# Patient Record
Sex: Male | Born: 1960 | Race: White | Hispanic: No | Marital: Married | Smoking: Never smoker
Health system: Southern US, Community
[De-identification: ages and names within clinical notes are randomized; demographics above are authoritative.]

## PROBLEM LIST (undated history)

## (undated) DIAGNOSIS — K219 Gastro-esophageal reflux disease without esophagitis: Secondary | ICD-10-CM

## (undated) DIAGNOSIS — F419 Anxiety disorder, unspecified: Secondary | ICD-10-CM

## (undated) DIAGNOSIS — F32A Depression, unspecified: Secondary | ICD-10-CM

## (undated) DIAGNOSIS — F329 Major depressive disorder, single episode, unspecified: Secondary | ICD-10-CM

## (undated) DIAGNOSIS — K449 Diaphragmatic hernia without obstruction or gangrene: Secondary | ICD-10-CM

## (undated) DIAGNOSIS — T7840XA Allergy, unspecified, initial encounter: Secondary | ICD-10-CM

## (undated) HISTORY — DX: Anxiety disorder, unspecified: F41.9

## (undated) HISTORY — DX: Depression, unspecified: F32.A

## (undated) HISTORY — DX: Major depressive disorder, single episode, unspecified: F32.9

## (undated) HISTORY — PX: CHOLECYSTECTOMY: SHX55

## (undated) HISTORY — DX: Diaphragmatic hernia without obstruction or gangrene: K44.9

## (undated) HISTORY — DX: Allergy, unspecified, initial encounter: T78.40XA

## (undated) HISTORY — DX: Gastro-esophageal reflux disease without esophagitis: K21.9

## (undated) HISTORY — PX: OTHER SURGICAL HISTORY: SHX169

---

## 1989-01-27 DIAGNOSIS — K449 Diaphragmatic hernia without obstruction or gangrene: Secondary | ICD-10-CM

## 1989-01-27 HISTORY — DX: Diaphragmatic hernia without obstruction or gangrene: K44.9

## 2015-04-21 ENCOUNTER — Ambulatory Visit (INDEPENDENT_AMBULATORY_CARE_PROVIDER_SITE_OTHER): Payer: BLUE CROSS/BLUE SHIELD | Admitting: Medical

## 2015-04-21 ENCOUNTER — Encounter: Payer: Self-pay | Admitting: Medical

## 2015-04-21 VITALS — BP 102/64 | HR 65 | Temp 97.9°F | Ht 74.5 in | Wt 218.4 lb

## 2015-04-21 DIAGNOSIS — R109 Unspecified abdominal pain: Secondary | ICD-10-CM | POA: Insufficient documentation

## 2015-04-21 DIAGNOSIS — F411 Generalized anxiety disorder: Secondary | ICD-10-CM

## 2015-04-21 DIAGNOSIS — R1032 Left lower quadrant pain: Secondary | ICD-10-CM

## 2015-04-21 DIAGNOSIS — Z1211 Encounter for screening for malignant neoplasm of colon: Secondary | ICD-10-CM | POA: Diagnosis not present

## 2015-04-21 DIAGNOSIS — R1010 Upper abdominal pain, unspecified: Secondary | ICD-10-CM | POA: Insufficient documentation

## 2015-04-21 LAB — COMPREHENSIVE METABOLIC PANEL
ALK PHOS: 39 U/L (ref 39–117)
ALT: 18 U/L (ref 0–53)
AST: 19 U/L (ref 0–37)
Albumin: 4 g/dL (ref 3.5–5.2)
BILIRUBIN TOTAL: 0.5 mg/dL (ref 0.2–1.2)
BUN: 14 mg/dL (ref 6–23)
CO2: 29 mEq/L (ref 19–32)
CREATININE: 0.99 mg/dL (ref 0.40–1.50)
Calcium: 9.4 mg/dL (ref 8.4–10.5)
Chloride: 105 mEq/L (ref 96–112)
GFR: 84.22 mL/min (ref >60.00)
GLUCOSE: 89 mg/dL (ref 70–99)
Potassium: 4.1 mEq/L (ref 3.5–5.1)
Sodium: 141 mEq/L (ref 135–145)
TOTAL PROTEIN: 6.6 g/dL (ref 6.0–8.3)

## 2015-04-21 LAB — CBC WITH DIFFERENTIAL/PLATELET
BASOS ABS: 0 10*3/uL (ref 0.0–0.1)
Basophils Relative: 0.5 % (ref 0.0–3.0)
Eosinophils Absolute: 0.2 10*3/uL (ref 0.0–0.7)
Eosinophils Relative: 2.1 % (ref 0.0–5.0)
HEMATOCRIT: 43.5 % (ref 39.0–52.0)
Hemoglobin: 14.3 g/dL (ref 13.0–17.0)
LYMPHS ABS: 1.9 10*3/uL (ref 0.7–4.0)
LYMPHS PCT: 25.6 % (ref 12.0–46.0)
MCHC: 33 g/dL (ref 30.0–36.0)
MCV: 89.6 fl (ref 78.0–100.0)
MONOS PCT: 9.7 % (ref 3.0–12.0)
Monocytes Absolute: 0.7 10*3/uL (ref 0.1–1.0)
NEUTROS PCT: 62.1 % (ref 43.0–77.0)
Neutro Abs: 4.5 10*3/uL (ref 1.4–7.7)
Platelets: 185 10*3/uL (ref 150.0–400.0)
RBC: 4.85 Mil/uL (ref 4.22–5.81)
RDW: 13.5 % (ref 11.5–15.5)
WBC: 7.3 10*3/uL (ref 4.0–10.5)

## 2015-04-21 MED ORDER — CIPROFLOXACIN HCL 500 MG PO TABS: 500.0000 mg | ORAL_TABLET | Freq: Two times a day (BID) | ORAL | Status: DC

## 2015-04-21 MED ORDER — METRONIDAZOLE 500 MG PO TABS: 500.0000 mg | ORAL_TABLET | Freq: Three times a day (TID) | ORAL | Status: DC

## 2015-04-21 MED ORDER — CLONAZEPAM 1 MG PO TABS: 1.0000 mg | ORAL_TABLET | Freq: Every day | ORAL | Status: DC

## 2015-04-21 NOTE — Assessment & Plan Note (Deleted)
Will get cbc, cmp today and order ifob. Will rx cipro and flagyl. If pain worsens over the weekend/thanksgiving  then ED evaluation. Will go ahead and refer pt to GI for Colonoscopy in near future.   In event he worsen this weekend. Explained that he may need ct abdomen.

## 2015-04-21 NOTE — Assessment & Plan Note (Signed)
Will get cbc, cmp today and order ifob. Will rx cipro and flagyl. If pain worsens over the weekend/thanksgiving  then ED evaluation. Will go ahead and refer pt to GI for Colonoscopy in near future.   In event he worsen this weekend. Explained that he may need ct abdomen.

## 2015-04-21 NOTE — Progress Notes (Signed)
Subjective:    Patient ID: Curtis Wood, male    DOB: 28-Nov-1960, 54 y.o.   MRN: [id number]  HPI   I have reviewed pt PMH, PSH, FH, Social History and Surgical History  Pt works for [employer] office in [place] Lived all his life in [place]. Just got employed.  Recent exercising. Pt states eats healthy, Married- 3 children.  Allergies- some in past and worse in [place]. Not bad locally.  Anxiety- hx of in past when he was in [place]. He would use clonopin for anxiety and insomnia. He only takes it at night.   Depression- some when he moved due to business closure in [place]. And when he moved to [place]. He was on SSRI lexapro in [place]. He is still on this. He wants to wean himself off eventually. Now that he is employed and moving to [place] he feels better.  Pt in states for about 2 wks he had some llq region pain that came gradual. No obvious diarrhea. No constant constipation. Some fatigue. No fever, no chills or sweats. Pt never had a colonoscopy. Pain is mild but he wanted to get checked before thanksgiving.   Review of Systems  Constitutional: Negative for fever, chills and fatigue.  Cardiovascular: Negative for chest pain and palpitations.  Gastrointestinal: Positive for abdominal pain. Negative for blood in stool and abdominal distention.  Musculoskeletal: Negative for back pain and arthralgias.  Skin: Negative for color change and rash.  Neurological: Negative for dizziness and light-headedness.  Hematological: Negative for adenopathy. Does not bruise/bleed easily.  Psychiatric/Behavioral: Positive for sleep disturbance. Negative for suicidal ideas, behavioral problems, decreased concentration and agitation. The patient is nervous/anxious.     Past Medical History  Diagnosis Date  . Allergy   . Anxiety   . Depression     Social History   Social History  . Marital Status: Married    Spouse Name: N/A  . Number of  Children: N/A  . Years of Education: N/A   Occupational History  . Not on file.   Social History Main Topics  . Smoking status: Never Smoker   . Smokeless tobacco: Never Used  . Alcohol Use: No     Comment: occasional wine   . Drug Use: No  . Sexual Activity: Not on file   Other Topics Concern  . Not on file   Social History Narrative  . No narrative on file    Past Surgical History  Procedure Laterality Date  . Cholecystectomy    . Surgery for pigeon breast repair    . Acl replacement Left     History reviewed. No pertinent family history.  No Known Allergies  No current outpatient prescriptions on file prior to visit.   No current facility-administered medications on file prior to visit.    BP 102/64 mmHg  Pulse 65  Temp(Src) 97.9 F (36.6 C) (Oral)  Ht 6' 2.5" (1.892 m)  Wt 218 lb 6.4 oz (99.066 kg)  BMI 27.67 kg/m2  SpO2 98%       Objective:   Physical Exam  General Mental Status- Alert. General Appearance- Not in acute distress.   Skin General: Color- Normal Color. Moisture- Normal Moisture.  Neck Carotid Arteries- Normal color. Moisture- Normal Moisture. No carotid bruits. No JVD.  Chest and Lung Exam Auscultation: Breath Sounds:-Normal.  Cardiovascular Auscultation:Rythm- Regular. Murmurs & Other Heart Sounds:Auscultation of the heart reveals- No Murmurs.  Abdomen Inspection:-Inspeection Normal. Palpation/Percussion:Note:No mass. Palpation and  Percussion of the abdomen reveal- only faint  Tender llq(no mass felt), Non Distended + BS, no rebound or guarding.  Neurologic Cranial Nerve exam:- CN III-XII intact(No nystagmus), symmetric smile. Strength:- 5/5 equal and symmetric strength both upper and lower extremities.  Back- no cva tenderness      Assessment & Plan:  [phone].

## 2015-04-21 NOTE — Assessment & Plan Note (Signed)
With some insomnia. Will still recommend using his lexapro 10 mg a day. Continue 1 mg of clonopin now.(Decrease from the 2 mg dose at night)

## 2015-04-21 NOTE — Patient Instructions (Signed)
Generalized anxiety disorder With some insomnia. Will still recommend using his lexapro 10 mg a day. Continue 1 mg of clonopin now.(Decrease from the 2 mg dose at night)  Pain in the abdomen Will get cbc, cmp today and order ifob. Will rx cipro and flagyl. If pain worsens over the weekend/thanksgiving  then ED evaluation. Will go ahead and refer pt to GI for Colonoscopy in near future.   In event he worsen this weekend. Explained that he may need ct abdomen.   Follow up in one month to discuss mood/anxiety.  But follow up early next week regarding abdomen. You can call on Monday update [place] or schedule that appointment.Marland Kitchen

## 2015-04-21 NOTE — Progress Notes (Signed)
Pre visit review using our clinic review tool, if applicable. No additional management support is needed unless otherwise documented below in the visit note. 

## 2015-04-26 ENCOUNTER — Telehealth: Payer: Self-pay | Admitting: Medical

## 2015-04-26 DIAGNOSIS — L989 Disorder of the skin and subcutaneous tissue, unspecified: Secondary | ICD-10-CM

## 2015-04-26 NOTE — Telephone Encounter (Signed)
Will you call pt tomorrow am on 04-27-2015.  See how he is doing. What level pain does he have in left lower quadrant . Did he turn in IFOB? Let me know how he is. I am either going to try to expedite his GI referral or go ahead and order ct abdomen pelvis. Let me know what he states. If he has extreme pain let me know I would probably advise direct ED evaluation if that were the case.(get stat ct and labs).

## 2015-04-26 NOTE — Telephone Encounter (Signed)
Edward please advise on note below on referral.

## 2015-04-26 NOTE — Telephone Encounter (Signed)
Caller name: Zamauri Relation to pt: self Call back number: [phone] Pharmacy:  Reason for call: Pt states was seen on 04-21-15 as new pt and mentioned that spoke with provider about being referred to Dermatologist for scalp, pt has not heard about referral, wants to be referred ASAP. Pt also mentioned that was given antibiotic that same day for abdominal pain on left side and states still is not feeling well pain is still there. Please advise.

## 2015-04-26 NOTE — Telephone Encounter (Signed)
Pt had very small area on scalp that had been present for months. Never getting better. He mentioned and showed me this toward end of exam on 1st visit. Will refer him to dermatologist.

## 2015-04-26 NOTE — Telephone Encounter (Signed)
Dr Rozann Lesches 11/30 @9 :30

## 2015-04-27 NOTE — Telephone Encounter (Signed)
Left message for pt to call back  °

## 2015-04-27 NOTE — Telephone Encounter (Signed)
Spoke with pt and states that he is going to see the GI this Thursday the 04/29/15. Pt is agreeable to any recommendations that ES and Gastroenterology advise. Per VO from ES the patient can go ahead with GI and wait for results to assess the need for a CT scan of the abdomen.

## 2015-04-29 ENCOUNTER — Ambulatory Visit (AMBULATORY_SURGERY_CENTER): Payer: Self-pay

## 2015-04-29 ENCOUNTER — Telehealth: Payer: Self-pay | Admitting: Medical

## 2015-04-29 ENCOUNTER — Other Ambulatory Visit (INDEPENDENT_AMBULATORY_CARE_PROVIDER_SITE_OTHER): Payer: BLUE CROSS/BLUE SHIELD

## 2015-04-29 VITALS — Ht 73.5 in | Wt 218.0 lb

## 2015-04-29 DIAGNOSIS — R1084 Generalized abdominal pain: Secondary | ICD-10-CM

## 2015-04-29 DIAGNOSIS — Z1211 Encounter for screening for malignant neoplasm of colon: Secondary | ICD-10-CM

## 2015-04-29 LAB — FECAL OCCULT BLOOD, IMMUNOCHEMICAL: FECAL OCCULT BLD: NEGATIVE

## 2015-04-29 MED ORDER — NA SULFATE-K SULFATE-MG SULF 17.5-3.13-1.6 GM/177ML PO SOLN: 1.0000 | Freq: Once | ORAL | Status: DC

## 2015-04-29 NOTE — Progress Notes (Signed)
No egg or soy allergies Not on home 02 No previous anesthesia complications No diet or weight loss meds 

## 2015-04-29 NOTE — Telephone Encounter (Signed)
Caller name: Neibert in Laboratory   Can be reached: [phone]   Reason for call: She is requesting a I FOB order for this pt. She says if you would please make the order future.

## 2015-04-29 NOTE — Telephone Encounter (Signed)
Order placed 04/29/15.

## 2015-05-03 ENCOUNTER — Telehealth: Payer: Self-pay | Admitting: Internal Medicine

## 2015-05-03 DIAGNOSIS — Z1211 Encounter for screening for malignant neoplasm of colon: Secondary | ICD-10-CM

## 2015-05-03 MED ORDER — NA SULFATE-K SULFATE-MG SULF 17.5-3.13-1.6 GM/177ML PO SOLN: 1.0000 | Freq: Once | ORAL | Status: DC

## 2015-05-03 NOTE — Telephone Encounter (Signed)
Resent suprep to pharmacy as requested by pt.  Called pt and LM on VM that identifies pt by first and last name that prep was resent and to call with further issues Lelan Pons PV

## 2015-05-06 ENCOUNTER — Encounter: Payer: Self-pay | Admitting: Internal Medicine

## 2015-05-13 ENCOUNTER — Encounter: Payer: Self-pay | Admitting: Internal Medicine

## 2015-05-13 ENCOUNTER — Ambulatory Visit (AMBULATORY_SURGERY_CENTER): Payer: BLUE CROSS/BLUE SHIELD | Admitting: Internal Medicine

## 2015-05-13 VITALS — BP 121/79 | HR 48 | Temp 96.3°F | Resp 16 | Ht 73.0 in | Wt 218.0 lb

## 2015-05-13 DIAGNOSIS — D123 Benign neoplasm of transverse colon: Secondary | ICD-10-CM

## 2015-05-13 DIAGNOSIS — D122 Benign neoplasm of ascending colon: Secondary | ICD-10-CM | POA: Diagnosis not present

## 2015-05-13 DIAGNOSIS — D120 Benign neoplasm of cecum: Secondary | ICD-10-CM

## 2015-05-13 DIAGNOSIS — Z1211 Encounter for screening for malignant neoplasm of colon: Secondary | ICD-10-CM | POA: Diagnosis present

## 2015-05-13 DIAGNOSIS — K635 Polyp of colon: Secondary | ICD-10-CM | POA: Diagnosis not present

## 2015-05-13 MED ORDER — SODIUM CHLORIDE 0.9 % IV SOLN: 500.0000 mL | INTRAVENOUS | Status: DC

## 2015-05-13 NOTE — Progress Notes (Signed)
Called to room to assist during endoscopic procedure.  Patient ID and intended procedure confirmed with present staff. Received instructions for my participation in the procedure from the performing physician.  

## 2015-05-13 NOTE — Patient Instructions (Signed)
YOU HAD AN ENDOSCOPIC PROCEDURE TODAY AT [facility]:   Refer to the procedure report that was given to you for any specific questions about what was found during the examination.  If the procedure report does not answer your questions, please call your gastroenterologist to clarify.  If you requested that your care partner not be given the details of your procedure findings, then the procedure report has been included in a sealed envelope for you to review at your convenience later.  YOU SHOULD EXPECT: Some feelings of bloating in the abdomen. Passage of more gas than usual.  Walking can help get rid of the air that was put into your GI tract during the procedure and reduce the bloating. If you had a lower endoscopy (such as a colonoscopy or flexible sigmoidoscopy) you may notice spotting of blood in your stool or on the toilet paper. If you underwent a bowel prep for your procedure, you may not have a normal bowel movement for a few days.  Please Note:  You might notice some irritation and congestion in your nose or some drainage.  This is from the oxygen used during your procedure.  There is no need for concern and it should clear up in a day or so.  SYMPTOMS TO REPORT IMMEDIATELY:   Following lower endoscopy (colonoscopy or flexible sigmoidoscopy):  Excessive amounts of blood in the stool  Significant tenderness or worsening of abdominal pains  Swelling of the abdomen that is new, acute  Fever of 100F or higher  For urgent or emergent issues, a gastroenterologist can be reached at any hour by calling [phone].   DIET: Your first meal following the procedure should be a small meal and then it is ok to progress to your normal diet. Heavy or fried foods are harder to digest and may make you feel nauseous or bloated.  Likewise, meals heavy in dairy and vegetables can increase bloating.  Drink plenty of fluids but you should avoid alcoholic beverages for 24  hours.  ACTIVITY:  You should plan to take it easy for the rest of today and you should NOT DRIVE or use heavy machinery until tomorrow (because of the sedation medicines used during the test).    FOLLOW UP: Our staff will call the number listed on your records the next business day following your procedure to check on you and address any questions or concerns that you may have regarding the information given to you following your procedure. If we do not reach you, we will leave a message.  However, if you are feeling well and you are not experiencing any problems, there is no need to return our call.  We will assume that you have returned to your regular daily activities without incident.  If any biopsies were taken you will be contacted by phone or by letter within the next 1-3 weeks.  Please call us at [phone] if you have not heard about the biopsies in 3 weeks.   SIGNATURES/CONFIDENTIALITY: You and/or your care partner have signed paperwork which will be entered into your electronic medical record.  These signatures attest to the fact that that the information above on your After Visit Summary has been reviewed and is understood.  Full responsibility of the confidentiality of this discharge information lies with you and/or your care-partner.  AWAIT PATHOLOGY  Please read over handouts about polyps, diverticulosis, and high fiber diets  Continue your normal medications

## 2015-05-13 NOTE — Progress Notes (Signed)
To recovery, report to [place]. Harden Mo, Therapist, sports, VSS

## 2015-05-13 NOTE — Op Note (Signed)
[place]  [address] [place], [address]   COLONOSCOPY PROCEDURE REPORT  PATIENT: Curtis Wood, Curtis Wood  MR#: [id number] BIRTHDATE: 1960/06/25 , 54  yrs. old GENDER: male ENDOSCOPIST: Jerene Bears, MD REFERRED BY: Dr. Harvie Heck PROCEDURE DATE:  05/13/2015 PROCEDURE:   Colonoscopy, screening and Colonoscopy with snare polypectomy First Screening Colonoscopy - Avg.  risk and is 50 yrs.  old or older Yes.  Prior Negative Screening - Now for repeat screening. N/A  History of Adenoma - Now for follow-up colonoscopy & has been > or = to 3 yrs.  N/A  Polyps removed today? Yes ASA CLASS:   Class II INDICATIONS:Screening for colonic neoplasia, Colorectal Neoplasm Risk Assessment for this procedure is average risk, and 1st colonoscopy. MEDICATIONS: Monitored anesthesia care and Propofol 200 mg IV  DESCRIPTION OF PROCEDURE:   After the risks benefits and alternatives of the procedure were thoroughly explained, informed consent was obtained.  The digital rectal exam revealed no rectal mass.   The LB [id number]  endoscope was introduced through the anus and advanced to the terminal ileum which was intubated for a short distance. No adverse events experienced.   The quality of the prep was good.  (Suprep was used)  The instrument was then slowly withdrawn as the colon was fully examined. Estimated blood loss is zero unless otherwise noted in this procedure report.    COLON FINDINGS: The examined terminal ileum appeared to be normal. A sessile polyp measuring 10 mm in size with a mucous cap was found at the cecum.  A polypectomy was performed with a cold snare.  The resection was complete, the polyp tissue was completely retrieved and sent to histology.   Two sessile polyps ranging between 3-53mm in size were found in the ascending colon and transverse colon. Polypectomies were performed with a cold snare.  The resection was complete, the polyp tissue was completely  retrieved and sent to histology.   There was mild diverticulosis noted in the descending colon and sigmoid colon.  Retroflexed views revealed internal hemorrhoids. The time to cecum = 3.2 Withdrawal time = 16.5   The scope was withdrawn and the procedure completed.  COMPLICATIONS: There were no immediate complications.  ENDOSCOPIC IMPRESSION: 1.   The examined terminal ileum appeared to be normal 2.   Sessile polyp was found at the cecum; polypectomy was performed with a cold snare 3.   Two sessile polyps ranging between 3-78mm in size were found in the ascending colon and transverse colon; polypectomies were performed with a cold snare 4.   Mild diverticulosis was noted in the descending colon and sigmoid colon  RECOMMENDATIONS: 1.  Await pathology results 2.  High fiber diet 3.  If the polyps removed today are proven to be adenomatous (pre-cancerous) polyps, you will need a colonoscopy in 3 years. Otherwise you should continue to follow colorectal cancer screening guidelines for "routine risk" patients with a colonoscopy in 10 years.  You will receive a letter within 1-2 weeks with the results of your biopsy as well as final recommendations.  Please call my office if you have not received a letter after 3 weeks.  eSigned:  Jerene Bears, MD 05/13/2015 12:07 PM   cc:  the patient, PCP   PATIENT NAME:  Curtis Wood, Curtis Wood MR#: [id number]

## 2015-05-14 ENCOUNTER — Telehealth: Payer: Self-pay | Admitting: *Deleted

## 2015-05-14 NOTE — Telephone Encounter (Signed)
  Follow up Call-  Call back number 05/13/2015  Post procedure Call Back phone  # [phone]  Permission to leave phone message Yes     Patient questions:  Message left to call us if necessary.

## 2015-05-21 ENCOUNTER — Ambulatory Visit (INDEPENDENT_AMBULATORY_CARE_PROVIDER_SITE_OTHER): Payer: BLUE CROSS/BLUE SHIELD | Admitting: Medical

## 2015-05-21 ENCOUNTER — Encounter: Payer: Self-pay | Admitting: Medical

## 2015-05-21 VITALS — BP 118/76 | HR 76 | Temp 98.1°F | Ht 74.5 in | Wt 220.2 lb

## 2015-05-21 DIAGNOSIS — F411 Generalized anxiety disorder: Secondary | ICD-10-CM | POA: Diagnosis not present

## 2015-05-21 DIAGNOSIS — R1032 Left lower quadrant pain: Secondary | ICD-10-CM

## 2015-05-21 DIAGNOSIS — Z23 Encounter for immunization: Secondary | ICD-10-CM | POA: Diagnosis not present

## 2015-05-21 MED ORDER — CLONAZEPAM 0.5 MG PO TABS: 0.5000 mg | ORAL_TABLET | Freq: Every day | ORAL | Status: DC

## 2015-05-21 NOTE — Assessment & Plan Note (Signed)
Resolved with antibiotics. Pt saw GI.

## 2015-05-21 NOTE — Addendum Note (Signed)
Addended by: Tasia Catchings on: 05/21/2015 09:54 AM   Modules accepted: Orders

## 2015-05-21 NOTE — Patient Instructions (Signed)
Generalized anxiety disorder Will continue same dose lexapro. But will rx clonazapem but 0.5 mg dose to continue taper. Call me in one month or mychart me to let me know how you are and then may taper clonazepam further.  Pain in the abdomen Resolved with antibiotics. Pt saw GI.    Follow up in 2 months for CPE.

## 2015-05-21 NOTE — Assessment & Plan Note (Signed)
Will continue same dose lexapro. But will rx clonazapem but 0.5 mg dose to continue taper. Call me in one month or mychart me to let me know how you are and then may taper clonazepam further.

## 2015-05-21 NOTE — Progress Notes (Signed)
Subjective:    Patient ID: Curtis Wood, male    DOB: [birth year], 54 y.o.   MRN: [id number]  HPI   Pt in for follow up. He had some polyps and mild diverticulosis. Pt will follow up in 3 months.  Pt in states he feels good with his mood. He is not feeling real  anxious. Pt states he only taking clonopin 1 mg one time a day at night. Previously he was on 2 mg at night. He wants to get off but wants to avoid withdrawal type symptoms.  He wants to stop lexapro. But we discussed and advised stay on current dose. Not to taper off presently.  With  New job and getting settled in prior anxiety is dissipating.  Pt no longer has any abdomen pain. I rx'd flagyl and cipro. Pt later had diverticulosis dx by GI on scope. So he may have had diverticulitis.   Review of Systems  Constitutional: Negative for fever, chills, diaphoresis, activity change and fatigue.  Respiratory: Negative for cough, chest tightness and shortness of breath.   Cardiovascular: Negative for chest pain, palpitations and leg swelling.  Gastrointestinal: Negative for nausea, vomiting and abdominal pain.  Musculoskeletal: Negative for neck pain and neck stiffness.  Neurological: Negative for dizziness, speech difficulty, weakness, numbness and headaches.  Psychiatric/Behavioral: Negative for behavioral problems, confusion, dysphoric mood and agitation. The patient is not nervous/anxious.        Pt mood and anxiety is better.   Past Medical History  Diagnosis Date  . Allergy   . Anxiety   . Depression   . GERD (gastroesophageal reflux disease)   . Hiatal hernia 1990's    Social History   Social History  . Marital Status: Married    Spouse Name: N/A  . Number of Children: N/A  . Years of Education: N/A   Occupational History  . Not on file.   Social History Main Topics  . Smoking status: Never Smoker   . Smokeless tobacco: Never Used  . Alcohol Use: 0.0 oz/week    0 Standard drinks or equivalent per week       Comment: occasional wine   . Drug Use: No  . Sexual Activity: Not on file   Other Topics Concern  . Not on file   Social History Narrative    Past Surgical History  Procedure Laterality Date  . Cholecystectomy    . Surgery for pigeon breast repair    . Acl replacement Left     Family History  Problem Relation Age of Onset  . Colon cancer Neg Hx     No Known Allergies  Current Outpatient Prescriptions on File Prior to Visit  Medication Sig Dispense Refill  . escitalopram (LEXAPRO) 10 MG tablet Take 10 mg by mouth daily.     No current facility-administered medications on file prior to visit.    BP 118/76 mmHg  Pulse 76  Temp(Src) 98.1 F (36.7 C) (Oral)  Ht 6' 2.5" (1.892 m)  Wt 220 lb 3.2 oz (99.882 kg)  BMI 27.90 kg/m2  SpO2 97%       Objective:   Physical Exam  General Mental Status- Alert. General Appearance- Not in acute distress.   Skin General: Color- Normal Color. Moisture- Normal Moisture.  .Chest and Lung Exam Auscultation: Breath Sounds:-Normal.  Cardiovascular Auscultation:Rythm- Regular. Murmurs & Other Heart Sounds:Auscultation of the heart reveals- No Murmurs.  Abdomen Inspection:-Inspeection Normal. Palpation/Percussion:Note:No mass. Palpation and Percussion of the abdomen reveal- Non Tender, Non Distended +  BS, no rebound or guarding.    Neurologic Cranial Nerve exam:- CN III-XII intact(No nystagmus), symmetric smile. Strength:- 5/5 equal and symmetric strength both upper and lower extremities.      Assessment & Plan:

## 2015-05-21 NOTE — Progress Notes (Signed)
Pre visit review using our clinic review tool, if applicable. No additional management support is needed unless otherwise documented below in the visit note. 

## 2015-05-25 ENCOUNTER — Encounter: Payer: Self-pay | Admitting: Internal Medicine

## 2015-06-24 ENCOUNTER — Encounter: Payer: Self-pay | Admitting: Medical

## 2015-06-24 ENCOUNTER — Ambulatory Visit (INDEPENDENT_AMBULATORY_CARE_PROVIDER_SITE_OTHER): Payer: BLUE CROSS/BLUE SHIELD | Admitting: Medical

## 2015-06-24 VITALS — BP 120/78 | HR 78 | Temp 98.1°F | Ht 74.5 in | Wt 220.2 lb

## 2015-06-24 DIAGNOSIS — M542 Cervicalgia: Secondary | ICD-10-CM

## 2015-06-24 DIAGNOSIS — H109 Unspecified conjunctivitis: Secondary | ICD-10-CM | POA: Diagnosis not present

## 2015-06-24 DIAGNOSIS — J3489 Other specified disorders of nose and nasal sinuses: Secondary | ICD-10-CM

## 2015-06-24 DIAGNOSIS — R5383 Other fatigue: Secondary | ICD-10-CM

## 2015-06-24 DIAGNOSIS — R6882 Decreased libido: Secondary | ICD-10-CM

## 2015-06-24 MED ORDER — CLONAZEPAM 0.5 MG PO TABS: 0.5000 mg | ORAL_TABLET | Freq: Every day | ORAL | Status: DC

## 2015-06-24 MED ORDER — TOBRAMYCIN 0.3 % OP SOLN: 2.0000 [drp] | Freq: Four times a day (QID) | OPHTHALMIC | Status: DC

## 2015-06-24 MED ORDER — FLUTICASONE PROPIONATE 50 MCG/ACT NA SUSP: 2.0000 | Freq: Every day | NASAL | Status: DC

## 2015-06-24 MED ORDER — DICLOFENAC SODIUM 75 MG PO TBEC: 75.0000 mg | DELAYED_RELEASE_TABLET | Freq: Two times a day (BID) | ORAL | Status: DC

## 2015-06-24 NOTE — Patient Instructions (Addendum)
For conjunctivitis will prescribe tobrex eye solution. Use on both eyes.  For neck pain will get cspine xray. Rx of diclofenac for pain.  For sinus pressure will prescribe flonase nasal spray. Will see how you respond to spray. Then consider antibiotic.  Follow up 7 days or as needed.

## 2015-06-24 NOTE — Progress Notes (Signed)
Subjective:    Patient ID: Curtis Wood, male    DOB: 06-21-1960, 55 y.o.   MRN: [id number]  HPI   Rt eye irritation. Pt states his eye was red for 2 days. Pink appearance today. Pt creamy yellow discharge to his rt eye this am when he woke. No trauma or injury. No know contact to person with conjunctivitis.  Since last time I saw him he reports various symptoms and has scheduled physical exam.  Describes bloating abdomen, fatigue, and feeling like loosing muscle mass. Neck pain and some pain running down his arm at times.   Anxiety- pt still has some anxiety.  Pt stopped lexapro by himself. I wanted him to wait.   Review of Systems  Constitutional: Positive for fatigue. Negative for fever and chills.  Eyes: Positive for discharge and redness.  Respiratory: Negative for cough, chest tightness, shortness of breath and wheezing.   Cardiovascular: Negative for chest pain and palpitations.  Gastrointestinal:       Abdomen bloating.  Musculoskeletal: Negative for back pain.  Neurological: Negative for dizziness and headaches.  Hematological: Negative for adenopathy. Does not bruise/bleed easily.  Psychiatric/Behavioral: Negative for behavioral problems and confusion.    Past Medical History  Diagnosis Date  . Allergy   . Anxiety   . Depression   . GERD (gastroesophageal reflux disease)   . Hiatal hernia 1990's    Social History   Social History  . Marital Status: Married    Spouse Name: N/A  . Number of Children: N/A  . Years of Education: N/A   Occupational History  . Not on file.   Social History Main Topics  . Smoking status: Never Smoker   . Smokeless tobacco: Never Used  . Alcohol Use: 0.0 oz/week    0 Standard drinks or equivalent per week     Comment: occasional wine   . Drug Use: No  . Sexual Activity: Not on file   Other Topics Concern  . Not on file   Social History Narrative    Past Surgical History  Procedure Laterality Date  .  Cholecystectomy    . Surgery for pigeon breast repair    . Acl replacement Left     Family History  Problem Relation Age of Onset  . Colon cancer Neg Hx     No Known Allergies  Current Outpatient Prescriptions on File Prior to Visit  Medication Sig Dispense Refill  . escitalopram (LEXAPRO) 10 MG tablet Take 10 mg by mouth daily.     No current facility-administered medications on file prior to visit.    BP 120/78 mmHg  Pulse 78  Temp(Src) 98.1 F (36.7 C) (Oral)  Ht 6' 2.5" (1.892 m)  Wt 220 lb 3.2 oz (99.882 kg)  BMI 27.90 kg/m2  SpO2 97%       Objective:   Physical Exam  General  Mental Status - Alert. General Appearance - Well groomed. Not in acute distress.  Rt eye- mild conjuncitva injection( no matting presently). Lt eye- normal.  Skin Rashes- No Rashes.  HEENT Head- Normal. Ear Auditory Canal - Left- Normal. Right - Normal.Tympanic Membrane- Left- Normal. Right- Normal. Eye Sclera/Conjunctiva- Left- Normal. Right- Normal. Nose & Sinuses Nasal Mucosa- Left-  Boggy and Congested. Right-  Boggy and  Congested.Bilateral no maxillary and no frontal sinus pressure. Mouth & Throat Lips: Upper Lip- Normal: no dryness, cracking, pallor, cyanosis, or vesicular eruption. Lower Lip-Normal: no dryness, cracking, pallor, cyanosis or vesicular eruption. Buccal Mucosa- Bilateral- No  Aphthous ulcers. Oropharynx- No Discharge or Erythema. Tonsils: Characteristics- Bilateral- No Erythema or Congestion. Size/Enlargement- Bilateral- No enlargement. Discharge- bilateral-None.  Neck Neck- Supple. No Masses. Mid lower neck pain posterior aspect    Chest and Lung Exam Auscultation: Breath Sounds:-Clear even and unlabored.  Cardiovascular Auscultation:Rythm- Regular, rate and rhythm. Murmurs & Other Heart Sounds:Ausculatation of the heart reveal- No Murmurs.  Lymphatic Head & Neck General Head & Neck Lymphatics: Bilateral: Description- No Localized  lymphadenopathy.       Assessment & Plan:  For conjunctivitis will prescribe tobrex eye solution. Use on both eyes.  For neck pain will get cspine xray. Rx of diclofenac for pain.  For sinus pressure will prescribe flonase nasal spray. Will see how you respond to spray. Then consider antibiotic.  Follow up 7 days or as needed.  Pt only scheduled for 15 minutes then listed various complaints. I simply did not have enough time to fully address all these complaints but did order cspine xray. And due to his decrease libido and fatigue complaint  Placed order to have testosterone to be done early am. Also ask he move his complete physical exam date up if possible.

## 2015-06-24 NOTE — Progress Notes (Signed)
Pre visit review using our clinic review tool, if applicable. No additional management support is needed unless otherwise documented below in the visit note. 

## 2015-06-25 ENCOUNTER — Other Ambulatory Visit (INDEPENDENT_AMBULATORY_CARE_PROVIDER_SITE_OTHER): Payer: BLUE CROSS/BLUE SHIELD

## 2015-06-25 ENCOUNTER — Telehealth: Payer: Self-pay | Admitting: General Practice

## 2015-06-25 ENCOUNTER — Telehealth: Payer: Self-pay | Admitting: Medical

## 2015-06-25 ENCOUNTER — Encounter: Payer: Self-pay | Admitting: General Practice

## 2015-06-25 ENCOUNTER — Ambulatory Visit: Payer: BLUE CROSS/BLUE SHIELD | Admitting: Medical

## 2015-06-25 DIAGNOSIS — R5383 Other fatigue: Secondary | ICD-10-CM

## 2015-06-25 DIAGNOSIS — R6882 Decreased libido: Secondary | ICD-10-CM | POA: Diagnosis not present

## 2015-06-25 NOTE — Telephone Encounter (Signed)
Test code has recently changed to [id number] for men. Test does have women in parenthesis by it due to error in epic or [facility] part.

## 2015-06-25 NOTE — Telephone Encounter (Signed)
Caller name:Willim Relationship to patient:self Can be reached: Pharmacy:[facility]  Reason for call:patient was in yesterday  And his klonizapam rx was not at [facility] with his other meds

## 2015-06-25 NOTE — Telephone Encounter (Signed)
Curtis Wood I saw my order was cancelled then other other was placed that showed male beside testosterone level?? Don't understand why that was done.

## 2015-06-25 NOTE — Telephone Encounter (Signed)
Pre-visit phone call for pt completed. Chart updated to reflect.  Per pt clonazepam refill was never received by pharmacy, this was called in on 06/25/15 (authorized by Ronalee Belts at [employer])

## 2015-06-28 ENCOUNTER — Ambulatory Visit (HOSPITAL_BASED_OUTPATIENT_CLINIC_OR_DEPARTMENT_OTHER)
Admission: RE | Admit: 2015-06-28 | Discharge: 2015-06-28 | Disposition: A | Discharge status: Home / Self Care | Payer: BLUE CROSS/BLUE SHIELD | Source: Ambulatory Visit | Attending: Medical | Admitting: Medical

## 2015-06-28 ENCOUNTER — Ambulatory Visit (INDEPENDENT_AMBULATORY_CARE_PROVIDER_SITE_OTHER): Payer: BLUE CROSS/BLUE SHIELD | Admitting: Medical

## 2015-06-28 ENCOUNTER — Encounter: Payer: Self-pay | Admitting: Medical

## 2015-06-28 ENCOUNTER — Encounter: Payer: BLUE CROSS/BLUE SHIELD | Admitting: Medical

## 2015-06-28 ENCOUNTER — Other Ambulatory Visit: Payer: Self-pay | Admitting: Medical

## 2015-06-28 VITALS — BP 110/70 | HR 65 | Temp 98.1°F | Ht 74.5 in | Wt 222.0 lb

## 2015-06-28 DIAGNOSIS — M542 Cervicalgia: Secondary | ICD-10-CM

## 2015-06-28 DIAGNOSIS — Z0000 Encounter for general adult medical examination without abnormal findings: Secondary | ICD-10-CM | POA: Insufficient documentation

## 2015-06-28 DIAGNOSIS — Z125 Encounter for screening for malignant neoplasm of prostate: Secondary | ICD-10-CM

## 2015-06-28 DIAGNOSIS — M5412 Radiculopathy, cervical region: Secondary | ICD-10-CM | POA: Diagnosis not present

## 2015-06-28 LAB — POC URINALSYSI DIPSTICK (AUTOMATED)
BILIRUBIN UA: NEGATIVE
Blood, UA: NEGATIVE
GLUCOSE UA: NEGATIVE
KETONES UA: NEGATIVE
LEUKOCYTES UA: NEGATIVE
Nitrite, UA: NEGATIVE
PROTEIN UA: NEGATIVE
Spec Grav, UA: 1.025
Urobilinogen, UA: 0.2
pH, UA: 6

## 2015-06-28 LAB — COMPREHENSIVE METABOLIC PANEL
ALT: 13 U/L (ref 0–53)
AST: 17 U/L (ref 0–37)
Albumin: 4.3 g/dL (ref 3.5–5.2)
Alkaline Phosphatase: 34 U/L — ABNORMAL LOW (ref 39–117)
BUN: 19 mg/dL (ref 6–23)
CALCIUM: 9.2 mg/dL (ref 8.4–10.5)
CHLORIDE: 104 meq/L (ref 96–112)
CO2: 30 meq/L (ref 19–32)
Creatinine, Ser: 0.97 mg/dL (ref 0.40–1.50)
GFR: 86.17 mL/min (ref >60.00)
GLUCOSE: 89 mg/dL (ref 70–99)
Potassium: 3.9 mEq/L (ref 3.5–5.1)
Sodium: 141 mEq/L (ref 135–145)
Total Bilirubin: 0.5 mg/dL (ref 0.2–1.2)
Total Protein: 6.7 g/dL (ref 6.0–8.3)

## 2015-06-28 LAB — CBC WITH DIFFERENTIAL/PLATELET
BASOS PCT: 0.5 % (ref 0.0–3.0)
Basophils Absolute: 0 10*3/uL (ref 0.0–0.1)
EOS ABS: 0.2 10*3/uL (ref 0.0–0.7)
Eosinophils Relative: 3.3 % (ref 0.0–5.0)
HEMATOCRIT: 44.5 % (ref 39.0–52.0)
Hemoglobin: 14.5 g/dL (ref 13.0–17.0)
LYMPHS ABS: 1.8 10*3/uL (ref 0.7–4.0)
LYMPHS PCT: 27 % (ref 12.0–46.0)
MCHC: 32.7 g/dL (ref 30.0–36.0)
MCV: 89.4 fl (ref 78.0–100.0)
Monocytes Absolute: 0.5 10*3/uL (ref 0.1–1.0)
Monocytes Relative: 7.9 % (ref 3.0–12.0)
NEUTROS ABS: 4 10*3/uL (ref 1.4–7.7)
Neutrophils Relative %: 61.3 % (ref 43.0–77.0)
PLATELETS: 191 10*3/uL (ref 150.0–400.0)
RBC: 4.97 Mil/uL (ref 4.22–5.81)
RDW: 13.4 % (ref 11.5–15.5)
WBC: 6.5 10*3/uL (ref 4.0–10.5)

## 2015-06-28 LAB — LIPID PANEL
CHOL/HDL RATIO: 3
CHOLESTEROL: 171 mg/dL (ref 0–200)
HDL: 55.7 mg/dL (ref >39.00)
LDL CALC: 107 mg/dL — AB (ref 0–99)
NonHDL: 115.53
TRIGLYCERIDES: 41 mg/dL (ref 0.0–149.0)
VLDL: 8.2 mg/dL (ref 0.0–40.0)

## 2015-06-28 LAB — TSH: TSH: 1.14 u[IU]/mL (ref 0.35–4.50)

## 2015-06-28 LAB — PSA: PSA: 1.52 ng/mL (ref 0.10–4.00)

## 2015-06-28 MED ORDER — MOMETASONE FUROATE 50 MCG/ACT NA SUSP: NASAL | Status: DC

## 2015-06-28 NOTE — Patient Instructions (Addendum)
Wellness examination Cbc, cmp, tsh, psa and lipid panel. Will get UA.     Will follow above labs for your fatigue and other symptoms.(Will ask labs to draw more in event abnormality)  Regarding mood would offer counseling if you feel you would benefit from.  Follow date to be determined after labs are back.  I called downstairs and ask them to run nasonex. Per pharmacy they don't pay for any prescription. He has to get otc.   Preventive Care for Adults, Male A healthy lifestyle and preventive care can promote health and wellness. Preventive health guidelines for men include the following key practices:  A routine yearly physical is a good way to check with your health care provider about your health and preventative screening. It is a chance to share any concerns and updates on your health and to receive a thorough exam.  Visit your dentist for a routine exam and preventative care every 6 months. Brush your teeth twice a day and floss once a day. Good oral hygiene prevents tooth decay and gum disease.  The frequency of eye exams is based on your age, health, family medical history, use of contact lenses, and other factors. Follow your health care provider's recommendations for frequency of eye exams.  Eat a healthy diet. Foods such as vegetables, fruits, whole grains, low-fat dairy products, and lean protein foods contain the nutrients you need without too many calories. Decrease your intake of foods high in solid fats, added sugars, and salt. Eat the right amount of calories for you.Get information about a proper diet from your health care provider, if necessary.  Regular physical exercise is one of the most important things you can do for your health. Most adults should get at least 150 minutes of moderate-intensity exercise (any activity that increases your heart rate and causes you to sweat) each week. In addition, most adults need muscle-strengthening exercises on 2 or more days a  week.  Maintain a healthy weight. The body mass index (BMI) is a screening tool to identify possible weight problems. It provides an estimate of body fat based on height and weight. Your health care provider can find your BMI and can help you achieve or maintain a healthy weight.For adults 20 years and older:  A BMI below 18.5 is considered underweight.  A BMI of 18.5 to 24.9 is normal.  A BMI of 25 to 29.9 is considered overweight.  A BMI of 30 and above is considered obese.  Maintain normal blood lipids and cholesterol levels by exercising and minimizing your intake of saturated fat. Eat a balanced diet with plenty of fruit and vegetables. Blood tests for lipids and cholesterol should begin at age 57 and be repeated every 5 years. If your lipid or cholesterol levels are high, you are over 50, or you are at high risk for heart disease, you may need your cholesterol levels checked more frequently.Ongoing high lipid and cholesterol levels should be treated with medicines if diet and exercise are not working.  If you smoke, find out from your health care provider how to quit. If you do not use tobacco, do not start.  Lung cancer screening is recommended for adults aged 25-80 years who are at high risk for developing lung cancer because of a history of smoking. A yearly low-dose CT scan of the lungs is recommended for people who have at least a 30-pack-year history of smoking and are a current smoker or have quit within the past 15 years. A  pack year of smoking is smoking an average of 1 pack of cigarettes a day for 1 year (for example: 1 pack a day for 30 years or 2 packs a day for 15 years). Yearly screening should continue until the smoker has stopped smoking for at least 15 years. Yearly screening should be stopped for people who develop a health problem that would prevent them from having lung cancer treatment.  If you choose to drink alcohol, do not have more than 2 drinks per day. One drink  is considered to be 12 ounces (355 mL) of beer, 5 ounces (148 mL) of wine, or 1.5 ounces (44 mL) of liquor.  Avoid use of street drugs. Do not share needles with anyone. Ask for help if you need support or instructions about stopping the use of drugs.  High blood pressure causes heart disease and increases the risk of stroke. Your blood pressure should be checked at least every 1-2 years. Ongoing high blood pressure should be treated with medicines, if weight loss and exercise are not effective.  If you are 97-56 years old, ask your health care provider if you should take aspirin to prevent heart disease.  Diabetes screening is done by taking a blood sample to check your blood glucose level after you have not eaten for a certain period of time (fasting). If you are not overweight and you do not have risk factors for diabetes, you should be screened once every 3 years starting at age 21. If you are overweight or obese and you are 61-23 years of age, you should be screened for diabetes every year as part of your cardiovascular risk assessment.  Colorectal cancer can be detected and often prevented. Most routine colorectal cancer screening begins at the age of 6 and continues through age 36. However, your health care provider may recommend screening at an earlier age if you have risk factors for colon cancer. On a yearly basis, your health care provider may provide home test kits to check for hidden blood in the stool. Use of a small camera at the end of a tube to directly examine the colon (sigmoidoscopy or colonoscopy) can detect the earliest forms of colorectal cancer. Talk to your health care provider about this at age 41, when routine screening begins. Direct exam of the colon should be repeated every 5-10 years through age 56, unless early forms of precancerous polyps or small growths are found.  People who are at an increased risk for hepatitis B should be screened for this virus. You are considered  at high risk for hepatitis B if:  You were born in a country where hepatitis B occurs often. Talk with your health care provider about which countries are considered high risk.  Your parents were born in a high-risk country and you have not received a shot to protect against hepatitis B (hepatitis B vaccine).  You have HIV or AIDS.  You use needles to inject street drugs.  You live with, or have sex with, someone who has hepatitis B.  You are a man who has sex with other men (MSM).  You get hemodialysis treatment.  You take certain medicines for conditions such as cancer, organ transplantation, and autoimmune conditions.  Hepatitis C blood testing is recommended for all people born from [birth year] through 1965 and any individual with known risks for hepatitis C.  Practice safe sex. Use condoms and avoid high-risk sexual practices to reduce the spread of sexually transmitted infections (STIs). STIs include gonorrhea,  chlamydia, syphilis, trichomonas, herpes, HPV, and human immunodeficiency virus (HIV). Herpes, HIV, and HPV are viral illnesses that have no cure. They can result in disability, cancer, and death.  If you are a man who has sex with other men, you should be screened at least once per year for:  HIV.  Urethral, rectal, and pharyngeal infection of gonorrhea, chlamydia, or both.  If you are at risk of being infected with HIV, it is recommended that you take a prescription medicine daily to prevent HIV infection. This is called preexposure prophylaxis (PrEP). You are considered at risk if:  You are a man who has sex with other men (MSM) and have other risk factors.  You are a heterosexual man, are sexually active, and are at increased risk for HIV infection.  You take drugs by injection.  You are sexually active with a partner who has HIV.  Talk with your health care provider about whether you are at high risk of being infected with HIV. If you choose to begin PrEP, you should  first be tested for HIV. You should then be tested every 3 months for as long as you are taking PrEP.  A one-time screening for abdominal aortic aneurysm (AAA) and surgical repair of large AAAs by ultrasound are recommended for men ages 67 to 77 years who are current or former smokers.  Healthy men should no longer receive prostate-specific antigen (PSA) blood tests as part of routine cancer screening. Talk with your health care provider about prostate cancer screening.  Testicular cancer screening is not recommended for adult males who have no symptoms. Screening includes self-exam, a health care provider exam, and other screening tests. Consult with your health care provider about any symptoms you have or any concerns you have about testicular cancer.  Use sunscreen. Apply sunscreen liberally and repeatedly throughout the day. You should seek shade when your shadow is shorter than you. Protect yourself by wearing long sleeves, pants, a wide-brimmed hat, and sunglasses year round, whenever you are outdoors.  Once a month, do a whole-body skin exam, using a mirror to look at the skin on your back. Tell your health care provider about new moles, moles that have irregular borders, moles that are larger than a pencil eraser, or moles that have changed in shape or color.  Stay current with required vaccines (immunizations).  Influenza vaccine. All adults should be immunized every year.  Tetanus, diphtheria, and acellular pertussis (Td, Tdap) vaccine. An adult who has not previously received Tdap or who does not know his vaccine status should receive 1 dose of Tdap. This initial dose should be followed by tetanus and diphtheria toxoids (Td) booster doses every 10 years. Adults with an unknown or incomplete history of completing a 3-dose immunization series with Td-containing vaccines should begin or complete a primary immunization series including a Tdap dose. Adults should receive a Td booster every 10  years.  Varicella vaccine. An adult without evidence of immunity to varicella should receive 2 doses or a second dose if he has previously received 1 dose.  Human papillomavirus (HPV) vaccine. Males aged 11-21 years who have not received the vaccine previously should receive the 3-dose series. Males aged 22-26 years may be immunized. Immunization is recommended through the age of 12 years for any male who has sex with males and did not get any or all doses earlier. Immunization is recommended for any person with an immunocompromised condition through the age of 32 years if he did not get  any or all doses earlier. During the 3-dose series, the second dose should be obtained 4-8 weeks after the first dose. The third dose should be obtained 24 weeks after the first dose and 16 weeks after the second dose.  Zoster vaccine. One dose is recommended for adults aged 55 years or older unless certain conditions are present.  Measles, mumps, and rubella (MMR) vaccine. Adults born before [birth year] generally are considered immune to measles and mumps. Adults born in [birth year] or later should have 1 or more doses of MMR vaccine unless there is a contraindication to the vaccine or there is laboratory evidence of immunity to each of the three diseases. A routine second dose of MMR vaccine should be obtained at least 28 days after the first dose for students attending postsecondary schools, health care workers, or international travelers. People who received inactivated measles vaccine or an unknown type of measles vaccine during 1963-1967 should receive 2 doses of MMR vaccine. People who received inactivated mumps vaccine or an unknown type of mumps vaccine before 1979 and are at high risk for mumps infection should consider immunization with 2 doses of MMR vaccine. Unvaccinated health care workers born before [birth year] who lack laboratory evidence of measles, mumps, or rubella immunity or laboratory confirmation of disease should  consider measles and mumps immunization with 2 doses of MMR vaccine or rubella immunization with 1 dose of MMR vaccine.  Pneumococcal 13-valent conjugate (PCV13) vaccine. When indicated, a person who is uncertain of his immunization history and has no record of immunization should receive the PCV13 vaccine. All adults 8 years of age and older should receive this vaccine. An adult aged 71 years or older who has certain medical conditions and has not been previously immunized should receive 1 dose of PCV13 vaccine. This PCV13 should be followed with a dose of pneumococcal polysaccharide (PPSV23) vaccine. Adults who are at high risk for pneumococcal disease should obtain the PPSV23 vaccine at least 8 weeks after the dose of PCV13 vaccine. Adults older than 53 years of age who have normal immune system function should obtain the PPSV23 vaccine dose at least 1 year after the dose of PCV13 vaccine.  Pneumococcal polysaccharide (PPSV23) vaccine. When PCV13 is also indicated, PCV13 should be obtained first. All adults aged 24 years and older should be immunized. An adult younger than age 32 years who has certain medical conditions should be immunized. Any person who resides in a nursing home or long-term care facility should be immunized. An adult smoker should be immunized. People with an immunocompromised condition and certain other conditions should receive both PCV13 and PPSV23 vaccines. People with human immunodeficiency virus (HIV) infection should be immunized as soon as possible after diagnosis. Immunization during chemotherapy or radiation therapy should be avoided. Routine use of PPSV23 vaccine is not recommended for American Indians, [place]atives, or people younger than 65 years unless there are medical conditions that require PPSV23 vaccine. When indicated, people who have unknown immunization and have no record of immunization should receive PPSV23 vaccine. One-time revaccination 5 years after the first  dose of PPSV23 is recommended for people aged 19-64 years who have chronic kidney failure, nephrotic syndrome, asplenia, or immunocompromised conditions. People who received 1-2 doses of PPSV23 before age 31 years should receive another dose of PPSV23 vaccine at age 89 years or later if at least 5 years have passed since the previous dose. Doses of PPSV23 are not needed for people immunized with PPSV23 at or after age 52 years.  Meningococcal vaccine. Adults with asplenia or persistent complement component deficiencies should receive 2 doses of quadrivalent meningococcal conjugate (MenACWY-D) vaccine. The doses should be obtained at least 2 months apart. Microbiologists working with certain meningococcal bacteria, [employer] recruits, people at risk during an outbreak, and people who travel to or live in countries with a high rate of meningitis should be immunized. A first-year college student up through age 8 years who is living in a residence hall should receive a dose if he did not receive a dose on or after his 16th birthday. Adults who have certain high-risk conditions should receive one or more doses of vaccine.  Hepatitis A vaccine. Adults who wish to be protected from this disease, have chronic liver disease, work with hepatitis A-infected animals, work in hepatitis A research labs, or travel to or work in countries with a high rate of hepatitis A should be immunized. Adults who were previously unvaccinated and who anticipate close contact with an international adoptee during the first 60 days after arrival in the [place] from a country with a high rate of hepatitis A should be immunized.  Hepatitis B vaccine. Adults should be immunized if they wish to be protected from this disease, are under age 61 years and have diabetes, have chronic liver disease, have had more than one sex partner in the past 6 months, may be exposed to blood or other infectious body fluids, are household contacts or sex  partners of hepatitis B positive people, are clients or workers in certain care facilities, or travel to or work in countries with a high rate of hepatitis B.  Haemophilus influenzae type b (Hib) vaccine. A previously unvaccinated person with asplenia or sickle cell disease or having a scheduled splenectomy should receive 1 dose of Hib vaccine. Regardless of previous immunization, a recipient of a hematopoietic stem cell transplant should receive a 3-dose series 6-12 months after his successful transplant. Hib vaccine is not recommended for adults with HIV infection. Preventive Service / Frequency Ages 55 to 15  Blood pressure check.** / Every 3-5 years.  Lipid and cholesterol check.** / Every 5 years beginning at age 62.  Hepatitis C blood test.** / For any individual with known risks for hepatitis C.  Skin self-exam. / Monthly.  Influenza vaccine. / Every year.  Tetanus, diphtheria, and acellular pertussis (Tdap, Td) vaccine.** / Consult your health care provider. 1 dose of Td every 10 years.  Varicella vaccine.** / Consult your health care provider.  HPV vaccine. / 3 doses over 6 months, if 75 or younger.  Measles, mumps, rubella (MMR) vaccine.** / You need at least 1 dose of MMR if you were born in 1957 or later. You may also need a second dose.  Pneumococcal 13-valent conjugate (PCV13) vaccine.** / Consult your health care provider.  Pneumococcal polysaccharide (PPSV23) vaccine.** / 1 to 2 doses if you smoke cigarettes or if you have certain conditions.  Meningococcal vaccine.** / 1 dose if you are age 18 to 52 years and a [employer] researcher living in a residence hall, or have one of several medical conditions. You may also need additional booster doses.  Hepatitis A vaccine.** / Consult your health care provider.  Hepatitis B vaccine.** / Consult your health care provider.  Haemophilus influenzae type b (Hib) vaccine.** / Consult your health care provider. Ages 31 to  4  Blood pressure check.** / Every year.  Lipid and cholesterol check.** / Every 5 years beginning at age 35.  Lung cancer screening. /  Every year if you are aged 23-80 years and have a 30-pack-year history of smoking and currently smoke or have quit within the past 15 years. Yearly screening is stopped once you have quit smoking for at least 15 years or develop a health problem that would prevent you from having lung cancer treatment.  Fecal occult blood test (FOBT) of stool. / Every year beginning at age 33 and continuing until age 39. You may not have to do this test if you get a colonoscopy every 10 years.  Flexible sigmoidoscopy** or colonoscopy.** / Every 5 years for a flexible sigmoidoscopy or every 10 years for a colonoscopy beginning at age 2 and continuing until age 24.  Hepatitis C blood test.** / For all people born from [birth year] through 1965 and any individual with known risks for hepatitis C.  Skin self-exam. / Monthly.  Influenza vaccine. / Every year.  Tetanus, diphtheria, and acellular pertussis (Tdap/Td) vaccine.** / Consult your health care provider. 1 dose of Td every 10 years.  Varicella vaccine.** / Consult your health care provider.  Zoster vaccine.** / 1 dose for adults aged 63 years or older.  Measles, mumps, rubella (MMR) vaccine.** / You need at least 1 dose of MMR if you were born in 1957 or later. You may also need a second dose.  Pneumococcal 13-valent conjugate (PCV13) vaccine.** / Consult your health care provider.  Pneumococcal polysaccharide (PPSV23) vaccine.** / 1 to 2 doses if you smoke cigarettes or if you have certain conditions.  Meningococcal vaccine.** / Consult your health care provider.  Hepatitis A vaccine.** / Consult your health care provider.  Hepatitis B vaccine.** / Consult your health care provider.  Haemophilus influenzae type b (Hib) vaccine.** / Consult your health care provider. Ages 64 and over  Blood pressure check.** /  Every year.  Lipid and cholesterol check.**/ Every 5 years beginning at age 30.  Lung cancer screening. / Every year if you are aged 48-80 years and have a 30-pack-year history of smoking and currently smoke or have quit within the past 15 years. Yearly screening is stopped once you have quit smoking for at least 15 years or develop a health problem that would prevent you from having lung cancer treatment.  Fecal occult blood test (FOBT) of stool. / Every year beginning at age 39 and continuing until age 75. You may not have to do this test if you get a colonoscopy every 10 years.  Flexible sigmoidoscopy** or colonoscopy.** / Every 5 years for a flexible sigmoidoscopy or every 10 years for a colonoscopy beginning at age 35 and continuing until age 46.  Hepatitis C blood test.** / For all people born from [birth year] through 1963 and any individual with known risks for hepatitis C.  Abdominal aortic aneurysm (AAA) screening.** / A one-time screening for ages 68 to 41 years who are current or former smokers.  Skin self-exam. / Monthly.  Influenza vaccine. / Every year.  Tetanus, diphtheria, and acellular pertussis (Tdap/Td) vaccine.** / 1 dose of Td every 10 years.  Varicella vaccine.** / Consult your health care provider.  Zoster vaccine.** / 1 dose for adults aged 10 years or older.  Pneumococcal 13-valent conjugate (PCV13) vaccine.** / 1 dose for all adults aged 62 years and older.  Pneumococcal polysaccharide (PPSV23) vaccine.** / 1 dose for all adults aged 40 years and older.  Meningococcal vaccine.** / Consult your health care provider.  Hepatitis A vaccine.** / Consult your health care provider.  Hepatitis B vaccine.** / Consult  your health care provider.  Haemophilus influenzae type b (Hib) vaccine.** / Consult your health care provider. **Family history and personal history of risk and conditions may change your health care provider's recommendations.   This information is not  intended to replace advice given to you by your health care provider. Make sure you discuss any questions you have with your health care provider.   Document Released: 07/11/2001 Document Revised: 06/05/2014 Document Reviewed: 10/10/2010 Elsevier Interactive Patient Education [employer].

## 2015-06-28 NOTE — Addendum Note (Signed)
Addended by: Tasia Catchings on: 06/28/2015 11:55 AM   Modules accepted: Orders

## 2015-06-28 NOTE — Progress Notes (Signed)
Pre visit review using our clinic review tool, if applicable. No additional management support is needed unless otherwise documented below in the visit note. 

## 2015-06-28 NOTE — Progress Notes (Signed)
Subjective:    Patient ID: Curtis Wood, male    DOB: 03-09-1961, 55 y.o.   MRN: [id number]  HPI   Pt is here for physical exam.  Pt is not exercising. Diet moderate healthy. Pt did have colonosocpy earlier in past year. He did have polyp and mild diverticulosis.  Pt has not record of psa being done in the chart.  Pt had numerous complaints on last visit.    Describes bloating abdomen, fatigue, and feeling like loosing muscle mass. Neck pain and some pain running down his arm at times.   Anxiety- pt still has some anxiety.  Pt stopped lexapro by himself. I wanted him to wait.  Of all these fatigue was the most prominent. With some decreased libido. I decided to to testosterone level last time and then have him come back for wellness exam.   Pt states both eyes are feeling better.   Review of Systems  Constitutional: Positive for fatigue.  HENT: Negative for congestion, drooling and sinus pressure.   Genitourinary:       Decreased libido.   Musculoskeletal: Positive for neck pain.  Neurological: Negative for dizziness, light-headedness and headaches.       Occasionally feels off balance at times.  Psychiatric/Behavioral: Positive for dysphoric mood. Negative for suicidal ideas, behavioral problems, self-injury and agitation. The patient is nervous/anxious.        Pt did stop the celexa.  Pt admits that he felt bad as he stopped using celex.     Past Medical History  Diagnosis Date  . Allergy   . Anxiety   . Depression   . GERD (gastroesophageal reflux disease)   . Hiatal hernia 1990's    Social History   Social History  . Marital Status: Married    Spouse Name: N/A  . Number of Children: N/A  . Years of Education: N/A   Occupational History  . Not on file.   Social History Main Topics  . Smoking status: Never Smoker   . Smokeless tobacco: Never Used  . Alcohol Use: 0.0 oz/week    0 Standard drinks or equivalent per week     Comment: occasional  wine   . Drug Use: No  . Sexual Activity: Not on file   Other Topics Concern  . Not on file   Social History Narrative    Past Surgical History  Procedure Laterality Date  . Cholecystectomy    . Surgery for pigeon breast repair    . Acl replacement Left     Family History  Problem Relation Age of Onset  . Colon cancer Neg Hx     No Known Allergies  Current Outpatient Prescriptions on File Prior to Visit  Medication Sig Dispense Refill  . clonazePAM (KLONOPIN) 0.5 MG tablet Take 1 tablet (0.5 mg total) by mouth at bedtime. 30 tablet 0  . diclofenac (VOLTAREN) 75 MG EC tablet Take 1 tablet (75 mg total) by mouth 2 (two) times daily. 30 tablet 0  . tobramycin (TOBREX) 0.3 % ophthalmic solution Place 2 drops into both eyes every 6 (six) hours. 5 mL 0   No current facility-administered medications on file prior to visit.    BP 110/70 mmHg  Pulse 65  Temp(Src) 98.1 F (36.7 C) (Oral)  Ht 6' 2.5" (1.892 m)  Wt 222 lb (100.699 kg)  BMI 28.13 kg/m2  SpO2 97%       Objective:   Physical Exam   General Mental Status- Alert. Orientation- Oriented x3.  Build and Nutrition- Well nourished and Well Developed.  Skin General:-Normal. Color- Normal color. Moisture- Normal. Temperature-Warm.  Small scattered moles throughout. One larger mole below rt clavicle. Pt states this has been seen by derm this year and determined benign at this point(this was evaluated when I sent him over for evaluation. He had lesion removed from his back.)  HEENT  Ears- Normal. Auditory Canal- Bilateral-Normal. Tympanic Membrane- Bilateral-Normal. Eye Fundi-Bilateral-Normal. Pupil- bilateral- Direct reaction to light normal. Nose & Sinuses- Normal. Nostrils-Bilateral- Normal. Mouth & Throat-Normal.  Neck Neck- No Bruits or Masses. Trachea midline.  Thyroid- Normal.  Chest and Lung Exam Percussion: Quality and Intensity-Percussion normal. Percussion of the chest reveals- No Dullness.    Palpation: Palpation of the chest reveals- Non-tender- No dullness. Auscultation: Breath Sounds- Normal.  Adventitous Sounds:-No adventitious sounds.  Cardiovascular Inspection:- No Heaves. Auscultation:-Normal sinus rhythm without murmur gallop, S1 WNL and S2 WNL.  Abdomen Inspection:-Inspection Normal. Inspection of the abdomen reveals- No hernias Palpation/Percussion:- Palpation and Percussion of the Abdomen reveal- Non Tender and No Palpable abdominal masses. Liver: Other Characteristics- No hepatomegaly. Spleen:Other Characteristics- No Splenomegaly. Auscultation:- Auscultation of the abdomen reveals- Bowel sounds normal and No Abdominal bruits.  Male Genitourinary Urethra:- No discharge. Penis- Circumcised. Scrotum- No masses. Testes- Bilateral-Normal.  Rectal Anorectal Exam: Performed- Normal sphincter tone. No masses noted. Prostate smooth normal size. Stool HEME Negative.  Peripheral  Vascular Lower Extremity:Inspection- Bilateral-Inspection Normal    Neurologic Mental Status:- Normal. Cranial Nerves:-Normal Bilaterally. Motor:-Normal. Strength:5/5 normal muscle strength-All Muscles.   Meningeal Signs- None.  Musculoskeletal Global Assessment General-Joints show full range of motion without obvious deformity and Normal muscle mass. Strength in upper and lower extremities.  Lymphatics General lymphatics Description- No generalized lymphadenopathy.     Assessment & Plan:

## 2015-06-28 NOTE — Assessment & Plan Note (Signed)
Cbc, cmp, tsh, psa and lipid panel. Will get UA.

## 2015-06-30 ENCOUNTER — Encounter: Payer: Self-pay | Admitting: Medical

## 2015-07-04 LAB — TESTOS,TOTAL,FREE AND SHBG (FEMALE)
SEX HORMONE BINDING GLOB.: 21 nmol/L (ref 10–50)
Testosterone, Free: 66.5 pg/mL (ref 35.0–155.0)
Testosterone,Total,LC/MS/MS: 336 ng/dL (ref 250–1100)

## 2015-07-22 ENCOUNTER — Encounter: Payer: BLUE CROSS/BLUE SHIELD | Admitting: Medical

## 2015-08-31 ENCOUNTER — Other Ambulatory Visit: Payer: Self-pay | Admitting: Medical

## 2015-09-01 MED ORDER — CLONAZEPAM 0.5 MG PO TABS
0.5000 mg | ORAL_TABLET | Freq: Every day | ORAL | Status: DC
Start: 2015-09-01 — End: 2015-12-16

## 2015-09-01 NOTE — Telephone Encounter (Signed)
Pt last OV 06/28/15.  Last Medication refill was 06/24/15.  Please advise on refill.

## 2015-09-01 NOTE — Telephone Encounter (Signed)
I refilled and printed pt klonopin. Will you notify pt. Also will sure check and see if he has controlled substance agreement?

## 2015-09-02 ENCOUNTER — Encounter: Payer: BLUE CROSS/BLUE SHIELD | Admitting: Medical

## 2015-09-02 NOTE — Progress Notes (Signed)
This encounter was created in error - please disregard.

## 2015-09-02 NOTE — Telephone Encounter (Signed)
I did send his klonopin to the pharmacy?

## 2015-09-02 NOTE — Telephone Encounter (Signed)
There is no controlled substance contract in the system. Will he need one?  Please advise. HM

## 2015-09-03 ENCOUNTER — Telehealth: Payer: Self-pay | Admitting: Medical

## 2015-09-03 ENCOUNTER — Encounter: Payer: Self-pay | Admitting: Medical

## 2015-09-03 NOTE — Telephone Encounter (Signed)
Waiving fee - mailing reminder letter

## 2015-09-03 NOTE — Telephone Encounter (Signed)
Pt was no show 09/02/15 1:15pm for follow up appt, pt has not rescheduled, 1st no show, charge or no charge?

## 2015-09-03 NOTE — Telephone Encounter (Signed)
No charge. 

## 2015-09-13 ENCOUNTER — Encounter: Payer: Self-pay | Admitting: Medical

## 2015-09-13 ENCOUNTER — Ambulatory Visit (INDEPENDENT_AMBULATORY_CARE_PROVIDER_SITE_OTHER): Payer: BLUE CROSS/BLUE SHIELD | Admitting: Medical

## 2015-09-13 VITALS — BP 112/78 | HR 78 | Temp 98.1°F | Ht 74.5 in | Wt 227.0 lb

## 2015-09-13 DIAGNOSIS — F32A Depression, unspecified: Secondary | ICD-10-CM

## 2015-09-13 DIAGNOSIS — G4700 Insomnia, unspecified: Secondary | ICD-10-CM

## 2015-09-13 DIAGNOSIS — F329 Major depressive disorder, single episode, unspecified: Secondary | ICD-10-CM

## 2015-09-13 MED ORDER — ZOLPIDEM TARTRATE 10 MG PO TABS: 10.0000 mg | ORAL_TABLET | Freq: Every evening | ORAL | Status: DC | PRN

## 2015-09-13 NOTE — Progress Notes (Signed)
Subjective:    Patient ID: Curtis Wood, male    DOB: 08-May-1961, 55 y.o.   MRN: [id number]  HPI  Pt in for follow up.   Pt has gained about 4 pounds since last week. Admits not being active over the past months.   Pt states eating healthy diet with more vegetables he gained wait. Sounds like he may have eaten more during that period. He also seemed to indicate high vegatable diet seemed to bother his digestion.  Pt states he did get off of lexapro. He has gotten off of clonazepam just recently. He only it couple of times as sleep aid.  He states he has some issues sleeping. He will have trouble falling  asleep and staying asleep. Pt tried [place] 2 times. Borrowed tabs from his wifes.  Pt touch base with me on his mood. He states difficulty still dealing with problems relating to move from [place]. He is willing to see psychiatrist. He appears depressed.  Pt testosterone was in normal limits.    Review of Systems  Constitutional: Negative for fever, chills and fatigue.  Respiratory: Negative for cough, chest tightness, shortness of breath and wheezing.   Cardiovascular: Negative for chest pain and palpitations.  Gastrointestinal: Negative for nausea, vomiting, abdominal pain, diarrhea and constipation.  Musculoskeletal: Negative for back pain, joint swelling and gait problem.  Neurological: Negative for dizziness and headaches.  Hematological: Negative for adenopathy. Does not bruise/bleed easily.  Psychiatric/Behavioral: Positive for sleep disturbance and dysphoric mood. Negative for hallucinations, confusion and agitation. The patient is not nervous/anxious.        Mood seems depressed. He is willing to see psychiatrist.  Describes emotional pain.    Past Medical History  Diagnosis Date  . Allergy   . Anxiety   . Depression   . GERD (gastroesophageal reflux disease)   . Hiatal hernia 1990's     Social History   Social History  . Marital Status: Married   Spouse Name: N/A  . Number of Children: N/A  . Years of Education: N/A   Occupational History  . Not on file.   Social History Main Topics  . Smoking status: Never Smoker   . Smokeless tobacco: Never Used  . Alcohol Use: 0.0 oz/week    0 Standard drinks or equivalent per week     Comment: occasional wine   . Drug Use: No  . Sexual Activity: Not on file   Other Topics Concern  . Not on file   Social History Narrative    Past Surgical History  Procedure Laterality Date  . Cholecystectomy    . Surgery for pigeon breast repair    . Acl replacement Left     Family History  Problem Relation Age of Onset  . Colon cancer Neg Hx     No Known Allergies  Current Outpatient Prescriptions on File Prior to Visit  Medication Sig Dispense Refill  . clonazePAM (KLONOPIN) 0.5 MG tablet Take 1 tablet (0.5 mg total) by mouth at bedtime. 30 tablet 0  . diclofenac (VOLTAREN) 75 MG EC tablet Take 1 tablet (75 mg total) by mouth 2 (two) times daily. 30 tablet 0  . mometasone (NASONEX) 50 MCG/ACT nasal spray 2 sprays each nares q day 17 g 2  . tobramycin (TOBREX) 0.3 % ophthalmic solution Place 2 drops into both eyes every 6 (six) hours. 5 mL 0   No current facility-administered medications on file prior to visit.    BP 112/78 mmHg  Pulse 78  Temp(Src) 98.1 F (36.7 C) (Oral)  Ht 6' 2.5" (1.892 m)  Wt 227 lb (102.967 kg)  BMI 28.76 kg/m2  SpO2 98%       Objective:   Physical Exam  General Mental Status- Alert. General Appearance- Not in acute distress. Flat affect.  Skin General: Color- Normal Color. Moisture- Normal Moisture.  Neck Carotid Arteries- Normal color. Moisture- Normal Moisture. No carotid bruits. No JVD.  Chest and Lung Exam Auscultation: Breath Sounds:-Normal.  Cardiovascular Auscultation:Rythm- Regular. Murmurs & Other Heart Sounds:Auscultation of the heart reveals- No Murmurs.  Abdomen Inspection:-Inspeection Normal. Palpation/Percussion:Note:No  mass. Palpation and Percussion of the abdomen reveal- Non Tender, Non Distended + BS, no rebound or guarding.    Neurologic Cranial Nerve exam:- CN III-XII intact(No nystagmus), symmetric smile. Strength:- 5/5 equal and symmetric strength both upper and lower extremities.      Assessment & Plan:  For insomnia will rx ambien.  Will refer to psychiatrist and can get counseling advise as well.  Some fatigue but negative cbc, cmp, and tsh. Including testosterone which was normal. Will see how you do at the psychiatrist appointments. May repeat testosterone within 6-12 months see if levels decreasing.  Note more than 50% if time was spent counseling with pt and then went to coordinate referral to psychiatrist with referral staff.  Follow up 4 weeks or as needed

## 2015-09-13 NOTE — Progress Notes (Signed)
Pre visit review using our clinic review tool, if applicable. No additional management support is needed unless otherwise documented below in the visit note. 

## 2015-09-13 NOTE — Patient Instructions (Addendum)
For insomnia will rx ambien.  Will refer to psychiatrist and can get counseling advise as well.  Some fatigue but negative cbc, cmp, and tsh. Including testosterone which was normal. Will see how you do at the psychiatrist appointments. May repeat testosterone within 6-12 months see if levels decreasing.  Follow up 4 weeks or as needed

## 2015-11-11 ENCOUNTER — Other Ambulatory Visit: Payer: Self-pay | Admitting: Medical

## 2015-11-11 NOTE — Telephone Encounter (Signed)
Gave pt refills of ambien. He can pick up rx.

## 2015-12-16 ENCOUNTER — Ambulatory Visit (INDEPENDENT_AMBULATORY_CARE_PROVIDER_SITE_OTHER): Payer: BLUE CROSS/BLUE SHIELD | Admitting: Medical

## 2015-12-16 ENCOUNTER — Encounter: Payer: Self-pay | Admitting: Medical

## 2015-12-16 VITALS — BP 111/68 | HR 85 | Temp 98.3°F | Ht 74.5 in | Wt 224.6 lb

## 2015-12-16 DIAGNOSIS — F32A Depression, unspecified: Secondary | ICD-10-CM

## 2015-12-16 DIAGNOSIS — G4700 Insomnia, unspecified: Secondary | ICD-10-CM | POA: Diagnosis not present

## 2015-12-16 DIAGNOSIS — F411 Generalized anxiety disorder: Secondary | ICD-10-CM

## 2015-12-16 DIAGNOSIS — F329 Major depressive disorder, single episode, unspecified: Secondary | ICD-10-CM | POA: Diagnosis not present

## 2015-12-16 MED ORDER — HYDROXYZINE HCL 25 MG PO TABS: ORAL_TABLET | ORAL | Status: DC

## 2015-12-16 MED ORDER — CLONAZEPAM 1 MG PO TABS: ORAL_TABLET | ORAL | Status: DC

## 2015-12-16 NOTE — Progress Notes (Addendum)
Subjective:    Patient ID: Curtis Wood, male    DOB: Sep 02, 1960, 55 y.o.   MRN: [id number]  HPI  Pt in for follow up.  Pt states in past he was having a lot of stress, anxiety and decreased some of meds. He stopped lexapro and clonazapam. He started walking about 4-8 miles a day. He felt better for a while.  Was not on any medication for various days.  Pt was using ambien as well. Helped fall asleep but hen he would wake up and not able to fall  Asleep.  Pt states at times will have anxiety and insomnia. He at times would take clonzapam 2 mg. He had old tabs and did this without my advisement. He does this after 2-3 days of having building up severe tension, anxiety and insomnia. Then he will take.   He states he will use 2 mg of clonazepam every once in a while. On average uses the 2 mg about twice a week.  Prior pcp in [place] had given him the clonazepam.     Review of Systems  Constitutional: Negative for chills and fatigue.  Respiratory: Negative for cough, chest tightness, shortness of breath and wheezing.   Cardiovascular: Negative for chest pain and palpitations.  Gastrointestinal: Negative for abdominal pain.  Musculoskeletal: Negative for back pain.  Skin: Negative for rash.  Neurological: Negative for dizziness, seizures and headaches.  Hematological: Negative for adenopathy. Does not bruise/bleed easily.  Psychiatric/Behavioral: Positive for dysphoric mood. Negative for suicidal ideas, confusion, self-injury and agitation. The patient is nervous/anxious.        At time mood decreased.    Past Medical History  Diagnosis Date  . Allergy   . Anxiety   . Depression   . GERD (gastroesophageal reflux disease)   . Hiatal hernia 1990's     Social History   Social History  . Marital Status: Married    Spouse Name: N/A  . Number of Children: N/A  . Years of Education: N/A   Occupational History  . Not on file.   Social History Main Topics  . Smoking  status: Never Smoker   . Smokeless tobacco: Never Used  . Alcohol Use: 0.0 oz/week    0 Standard drinks or equivalent per week     Comment: occasional wine   . Drug Use: No  . Sexual Activity: Not on file   Other Topics Concern  . Not on file   Social History Narrative    Past Surgical History  Procedure Laterality Date  . Cholecystectomy    . Surgery for pigeon breast repair    . Acl replacement Left     Family History  Problem Relation Age of Onset  . Colon cancer Neg Hx     No Known Allergies  Current Outpatient Prescriptions on File Prior to Visit  Medication Sig Dispense Refill  . clonazePAM (KLONOPIN) 0.5 MG tablet Take 1 tablet (0.5 mg total) by mouth at bedtime. 30 tablet 0  . diclofenac (VOLTAREN) 75 MG EC tablet Take 1 tablet (75 mg total) by mouth 2 (two) times daily. 30 tablet 0  . mometasone (NASONEX) 50 MCG/ACT nasal spray 2 sprays each nares q day 17 g 2  . tobramycin (TOBREX) 0.3 % ophthalmic solution Place 2 drops into both eyes every 6 (six) hours. 5 mL 0  . zolpidem (AMBIEN) 10 MG tablet TAKE 1 TABLET BY MOUTH AT BEDTIME AS NEEDED FOR SLEEP 30 tablet 2   No current  facility-administered medications on file prior to visit.    BP 111/68 mmHg  Pulse 85  Temp(Src) 98.3 F (36.8 C) (Oral)  Ht 6' 2.5" (1.892 m)  Wt 224 lb 9.6 oz (101.878 kg)  BMI 28.46 kg/m2  SpO2 96%      Objective:   Physical Exam  General Mental Status- Alert. General Appearance- Not in acute distress.   Skin General: Color- Normal Color. Moisture- Normal Moisture.  Neck Carotid Arteries- Normal color. Moisture- Normal Moisture. No carotid bruits. No JVD.  Chest and Lung Exam Auscultation: Breath Sounds:-Normal.  Cardiovascular Auscultation:Rythm- Regular. Murmurs & Other Heart Sounds:Auscultation of the heart reveals- No Murmurs.  Abdomen Inspection:-Inspeection Normal. Palpation/Percussion:Note:No mass. Palpation and Percussion of the abdomen reveal- Non  Tender, Non Distended + BS, no rebound or guarding.    Neurologic Cranial Nerve exam:- CN III-XII intact(No nystagmus), symmetric smile. Strength:- 5/5 equal and symmetric strength both upper and lower extremities.     Assessment & Plan:  You have stopped your meds and then restarted your your clonazepam. You are taking in bit of atypical manner and at high dose.   I tried to speak with our pharmacist but he was on a lunch break.   I would prefer that you use just 1 mg of clonazepam on night you have insomnia/anxiety. And if you still don't fall asleep by 30 minutes then add hydroxyzine. Hold [place] presently. When pharmacist calls me back I will discuss with him the 2 mg dosing use of clonazepam.  Will see how you do may need to get psychiatrist opinion.  After explaining plan pt wants to use hydroxyzine every night on occasion use clonazepam if needed  Follow up 1 month or as needed

## 2015-12-16 NOTE — Patient Instructions (Addendum)
You have stopped your meds and then restarted your your clonazepam. You are taking in bit of atypical manner and at high dose.   I tried to speak with our pharmacist but he was on a lunch break.   I would prefer that you use just 1 mg of clonazepam on night you have insomnia/anxiety. And if you still don't fall asleep by 30 minutes then add hydroxyzine. Hold [place] presently. When pharmacist calls me back I will discuss with him the 2 mg dosing use of clonazepam.  Will see how you do may need to get psychiatrist opinion.  After explaining plan pt wants to  use hydroxyzine every night on occasion use clonazepam if needed  Follow up 1 month or as needed

## 2015-12-16 NOTE — Progress Notes (Signed)
Pre visit review using our clinic review tool, if applicable. No additional management support is needed unless otherwise documented below in the visit note. 

## 2015-12-17 ENCOUNTER — Encounter (HOSPITAL_COMMUNITY): Payer: Self-pay | Admitting: Licensed Clinical Social Worker

## 2015-12-17 ENCOUNTER — Ambulatory Visit (INDEPENDENT_AMBULATORY_CARE_PROVIDER_SITE_OTHER): Payer: BLUE CROSS/BLUE SHIELD | Admitting: Licensed Clinical Social Worker

## 2015-12-17 DIAGNOSIS — F411 Generalized anxiety disorder: Secondary | ICD-10-CM

## 2015-12-17 DIAGNOSIS — F322 Major depressive disorder, single episode, severe without psychotic features: Secondary | ICD-10-CM | POA: Diagnosis not present
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
## 2015-12-28 ENCOUNTER — Ambulatory Visit (INDEPENDENT_AMBULATORY_CARE_PROVIDER_SITE_OTHER): Payer: BLUE CROSS/BLUE SHIELD | Admitting: Licensed Clinical Social Worker

## 2015-12-28 DIAGNOSIS — F322 Major depressive disorder, single episode, severe without psychotic features: Secondary | ICD-10-CM | POA: Diagnosis not present

## 2015-12-28 DIAGNOSIS — F411 Generalized anxiety disorder: Secondary | ICD-10-CM
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
## 2016-01-05 ENCOUNTER — Ambulatory Visit (INDEPENDENT_AMBULATORY_CARE_PROVIDER_SITE_OTHER): Payer: BLUE CROSS/BLUE SHIELD | Admitting: Medical

## 2016-01-05 ENCOUNTER — Encounter: Payer: Self-pay | Admitting: Medical

## 2016-01-05 VITALS — BP 124/80 | HR 68 | Temp 98.1°F | Ht 74.5 in | Wt 227.0 lb

## 2016-01-05 DIAGNOSIS — R1032 Left lower quadrant pain: Secondary | ICD-10-CM

## 2016-01-05 MED ORDER — CIPROFLOXACIN HCL 500 MG PO TABS: 500.0000 mg | ORAL_TABLET | Freq: Two times a day (BID) | ORAL | 0 refills | Status: DC

## 2016-01-05 MED ORDER — METRONIDAZOLE 500 MG PO TABS: 500.0000 mg | ORAL_TABLET | Freq: Three times a day (TID) | ORAL | 0 refills | Status: DC

## 2016-01-05 MED ORDER — CLONAZEPAM 1 MG PO TABS: ORAL_TABLET | ORAL | 0 refills | Status: DC

## 2016-01-05 MED ORDER — ZOLPIDEM TARTRATE 10 MG PO TABS: 10.0000 mg | ORAL_TABLET | Freq: Every evening | ORAL | 2 refills | Status: DC | PRN

## 2016-01-05 NOTE — Progress Notes (Signed)
Subjective:    Patient ID: Curtis Wood, male    DOB: 06-29-1960, 55 y.o.   MRN: [id number]  HPI   Pt in with some left lower quadrant area pain. Pt reminds me that I treated him for diverticultis. He had screening colonoscopy which showed diverticulosis. Pain has waxed and waned in severity. Pt states about 2 wks now. Last 3 days or so little worse. No fever, no chills or sweats. No diarrhea.About level 5 pain. Some fatigue. No constipation.   Denies any groin pain.   Pt did see psychologist recently. He is also scheduled to psychiatrist as well. He did not have good experience with psychologist. Pt has appointment with psychiatrist on 02-07-2016 for his depression and anxiety.     Review of Systems  Constitutional: Negative for chills, fatigue and fever.  HENT: Negative for dental problem.   Respiratory: Negative for cough, chest tightness, shortness of breath and wheezing.   Cardiovascular: Negative for chest pain and palpitations.  Gastrointestinal: Negative for abdominal pain.  Musculoskeletal: Negative for back pain.  Skin: Negative for pallor and rash.  Hematological: Negative for adenopathy. Does not bruise/bleed easily.  Psychiatric/Behavioral: Negative for behavioral problems and confusion.   Past Medical History:  Diagnosis Date  . Allergy   . Anxiety   . Depression   . GERD (gastroesophageal reflux disease)   . Hiatal hernia 1990's     Social History   Social History  . Marital status: Married    Spouse name: N/A  . Number of children: N/A  . Years of education: N/A   Occupational History  . Not on file.   Social History Main Topics  . Smoking status: Never Smoker  . Smokeless tobacco: Never Used  . Alcohol use 0.0 oz/week     Comment: occasional wine   . Drug use: No  . Sexual activity: Not on file   Other Topics Concern  . Not on file   Social History Narrative  . No narrative on file    Past Surgical History:  Procedure Laterality  Date  . Acl replacement Left   . CHOLECYSTECTOMY    . surgery for pigeon breast repair      Family History  Problem Relation Age of Onset  . Colon cancer Neg Hx     No Known Allergies  Current Outpatient Prescriptions on File Prior to Visit  Medication Sig Dispense Refill  . clonazePAM (KLONOPIN) 1 MG tablet 1 tab po q hs prn insomnia or anxiety 15 tablet 0  . diclofenac (VOLTAREN) 75 MG EC tablet Take 1 tablet (75 mg total) by mouth 2 (two) times daily. 30 tablet 0  . hydrOXYzine (ATARAX/VISTARIL) 25 MG tablet 1 tab po q hs prn anxiety or insomnia 15 tablet 0  . mometasone (NASONEX) 50 MCG/ACT nasal spray 2 sprays each nares q day 17 g 2  . tobramycin (TOBREX) 0.3 % ophthalmic solution Place 2 drops into both eyes every 6 (six) hours. 5 mL 0  . zolpidem (AMBIEN) 10 MG tablet TAKE 1 TABLET BY MOUTH AT BEDTIME AS NEEDED FOR SLEEP 30 tablet 2   No current facility-administered medications on file prior to visit.     BP 124/80 (BP Location: Left Arm, Patient Position: Sitting, Cuff Size: Normal)   Pulse 68   Temp 98.1 F (36.7 C) (Oral)   Ht 6' 2.5" (1.892 m)   Wt 227 lb (103 kg)   SpO2 98%   BMI 28.76 kg/m  Objective:   Physical Exam  General Appearance- Not in acute distress.  HEENT Eyes- Scleraeral/Conjuntiva-bilat- Not Yellow. Mouth & Throat- Normal.  Chest and Lung Exam Auscultation: Breath sounds:-Normal. Adventitious sounds:- No Adventitious sounds.  Cardiovascular Auscultation:Rythm - Regular. Heart Sounds -Normal heart sounds.  Abdomen Inspection:-Inspection Normal.  Palpation/Perucssion: Palpation and Percussion of the abdomen reveal- left lower quadrant Tender to palpitation moderate, No Rebound tenderness, No rigidity(Guarding) and No Palpable abdominal masses.  Liver:-Normal.  Spleen:- Normal.   Back- no cva pain.      Assessment & Plan:  You do likely have diverticulitis based on location of your pain and diverticulosis on prior  colonscopy.  Will rx cipro and flagyl and will get cbc. If pain perisists or worsens then get CT abdomen/pelvis.  I will refill you clonopin at night for your anxiety and insomnia. Try to use it every other night at most. If you need to use ambien to help to sleep then use but to use with clonopin at same time.  Follow up 10 days or as needed   Kingstyn Deruiter, Percell Miller, [employer]

## 2016-01-05 NOTE — Patient Instructions (Addendum)
You do likely have diverticulitis based on location of your pain and diverticulosis on prior colonscopy.  Will rx cipro and flagyl and will get cbc. If pain perisists or worsens then get CT abdomen/pelvis.  I will refill you clonopin at night for your anxiety and insomnia. Try to use it every other night at most. If you need to use ambien to help to sleep then use but to use with clonopin at same time.  Follow up 10 days or as needed

## 2016-01-05 NOTE — Progress Notes (Signed)
Pre visit review using our clinic review tool, if applicable. No additional management support is needed unless otherwise documented below in the visit note./HSM  

## 2016-01-06 ENCOUNTER — Other Ambulatory Visit: Payer: Self-pay | Admitting: Medical

## 2016-01-06 LAB — CBC WITH DIFFERENTIAL/PLATELET
BASOS PCT: 0.5 % (ref 0.0–3.0)
Basophils Absolute: 0 10*3/uL (ref 0.0–0.1)
EOS PCT: 3.3 % (ref 0.0–5.0)
Eosinophils Absolute: 0.2 10*3/uL (ref 0.0–0.7)
HCT: 43 % (ref 39.0–52.0)
HEMOGLOBIN: 14.4 g/dL (ref 13.0–17.0)
LYMPHS ABS: 1.6 10*3/uL (ref 0.7–4.0)
Lymphocytes Relative: 21.1 % (ref 12.0–46.0)
MCHC: 33.6 g/dL (ref 30.0–36.0)
MCV: 88.1 fl (ref 78.0–100.0)
MONO ABS: 0.8 10*3/uL (ref 0.1–1.0)
Monocytes Relative: 9.9 % (ref 3.0–12.0)
NEUTROS ABS: 4.9 10*3/uL (ref 1.4–7.7)
Neutrophils Relative %: 65.2 % (ref 43.0–77.0)
PLATELETS: 208 10*3/uL (ref 150.0–400.0)
RBC: 4.88 Mil/uL (ref 4.22–5.81)
RDW: 13.7 % (ref 11.5–15.5)
WBC: 7.6 10*3/uL (ref 4.0–10.5)

## 2016-01-06 NOTE — Progress Notes (Signed)
Pt has seen results on MyChart and message also sent for patient to call back if any questions.

## 2016-01-11 ENCOUNTER — Ambulatory Visit (INDEPENDENT_AMBULATORY_CARE_PROVIDER_SITE_OTHER): Payer: BLUE CROSS/BLUE SHIELD | Admitting: Licensed Clinical Social Worker

## 2016-01-11 DIAGNOSIS — F322 Major depressive disorder, single episode, severe without psychotic features: Secondary | ICD-10-CM

## 2016-01-11 DIAGNOSIS — F411 Generalized anxiety disorder: Secondary | ICD-10-CM | POA: Diagnosis not present

## 2016-01-12 ENCOUNTER — Encounter (HOSPITAL_COMMUNITY): Payer: Self-pay | Admitting: Licensed Clinical Social Worker

## 2016-01-12 NOTE — Progress Notes (Signed)
   THERAPIST PROGRESS NOTE  Session Time: 4:10-5:00pm  Participation Level: Active  Behavioral Response: NeatAlertAnxious  Type of Therapy: Individual Therapy  Treatment Goals addressed: Coping  Interventions: CBT, Strength-based and Supportive  Summary: Curtis Wood is a 55 y.o. male who presents with anxiety and depression. Pt presented anxious. Pt reports that his children are treating him disrespectfully and he Is hurt by this. PT is frustrated that he has done so much for them in the past and now they barely have anything to do with  Him. Pt struggles with boundaries with his children.The disarray with his children has caused problems in his current marriage. Pt just wants to throw his hands in the air and forget everyone. Pt was encouraged to use boundaries with children, use meditation and journaling as part of his coping tool for his anxiety.   Suicidal/Homicidal: Nowithout intent/plan  Therapist Response: Assessed pts current functioning And reviewed progress. Assisted pt processing issues with children, issues in marriage, processing for management of stressors.  Plan: Return again in 2 weeks using CBT-based therapy.  Diagnosis: Axis I: Depression and Anxiety        Muzamil Harker S, LCAS-A 01/12/2016

## 2016-01-21 NOTE — Telephone Encounter (Signed)
Pt never got his clonazapam sent in after I saw him early august. Will you call him and see if this was every handled. Please refer to my chart message I sent to him if you can view. Please call him on Monday.

## 2016-02-07 ENCOUNTER — Ambulatory Visit (HOSPITAL_COMMUNITY): Payer: Self-pay | Admitting: Psychiatry

## 2016-02-08 ENCOUNTER — Ambulatory Visit (HOSPITAL_COMMUNITY): Payer: BLUE CROSS/BLUE SHIELD | Admitting: Licensed Clinical Social Worker

## 2016-03-01 ENCOUNTER — Ambulatory Visit (INDEPENDENT_AMBULATORY_CARE_PROVIDER_SITE_OTHER): Payer: BLUE CROSS/BLUE SHIELD | Admitting: Licensed Clinical Social Worker

## 2016-03-01 DIAGNOSIS — F322 Major depressive disorder, single episode, severe without psychotic features: Secondary | ICD-10-CM | POA: Diagnosis not present

## 2016-03-06 ENCOUNTER — Encounter (HOSPITAL_COMMUNITY): Payer: Self-pay | Admitting: Licensed Clinical Social Worker
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
## 2016-03-08 ENCOUNTER — Encounter: Payer: Self-pay | Admitting: Medical

## 2016-03-08 ENCOUNTER — Other Ambulatory Visit: Payer: Self-pay | Admitting: Medical

## 2016-03-09 ENCOUNTER — Other Ambulatory Visit: Payer: Self-pay | Admitting: Medical

## 2016-03-09 NOTE — Telephone Encounter (Signed)
I am looking for patient  Controlled med contract and UDS. I can't find either. Will you let me know if you can find. If can't find then he needs to have appointment early next week.

## 2016-03-10 NOTE — Telephone Encounter (Signed)
Pt has been scheduled.  °

## 2016-03-10 NOTE — Telephone Encounter (Signed)
I believe I had asked for helped this week earlier with this pt. Trying to find if he had controlled med contract or drug screen done in past. I think I sent message to staff. Will you check on this. Please let me know if I am mistaken.

## 2016-03-10 NOTE — Telephone Encounter (Signed)
No CCS or UDS noted.  Message routed to scheduler to schedule an appt.

## 2016-03-10 NOTE — Telephone Encounter (Signed)
I saw your note. Thanks.

## 2016-03-13 ENCOUNTER — Encounter: Payer: Self-pay | Admitting: Medical

## 2016-03-13 ENCOUNTER — Ambulatory Visit (INDEPENDENT_AMBULATORY_CARE_PROVIDER_SITE_OTHER): Payer: BLUE CROSS/BLUE SHIELD | Admitting: Medical

## 2016-03-13 VITALS — BP 118/73 | HR 74 | Temp 97.8°F | Ht 75.0 in | Wt 224.8 lb

## 2016-03-13 DIAGNOSIS — G4700 Insomnia, unspecified: Secondary | ICD-10-CM

## 2016-03-13 DIAGNOSIS — F4323 Adjustment disorder with mixed anxiety and depressed mood: Secondary | ICD-10-CM | POA: Diagnosis not present

## 2016-03-13 MED ORDER — CLONAZEPAM 1 MG PO TABS: 1.0000 mg | ORAL_TABLET | Freq: Two times a day (BID) | ORAL | 2 refills | Status: DC | PRN

## 2016-03-13 MED ORDER — ZOLPIDEM TARTRATE 10 MG PO TABS: 10.0000 mg | ORAL_TABLET | Freq: Every evening | ORAL | 2 refills | Status: DC | PRN

## 2016-03-13 NOTE — Telephone Encounter (Signed)
Pt seen today by Percell Miller.
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
## 2016-03-13 NOTE — Progress Notes (Signed)
Pre visit review using our clinic tool,if applicable. No additional management support is needed unless otherwise documented below in the visit note.  

## 2016-03-13 NOTE — Patient Instructions (Addendum)
For your anxiety, depression and insomnia will rx clonazepam and ambien. Will wait and see if psychiatrist agrees and if he modifies the plan. Encouraged pt to attend that appointment.   Will get UDS today and controlled med contract today.  Pt ran out of contolled meds for 4 days.  Follow up 1 month(if not doing well). Provided you are not doing will want to see in one month or sooner)

## 2016-03-16 ENCOUNTER — Ambulatory Visit (INDEPENDENT_AMBULATORY_CARE_PROVIDER_SITE_OTHER): Payer: BLUE CROSS/BLUE SHIELD | Admitting: Licensed Clinical Social Worker

## 2016-03-16 DIAGNOSIS — F322 Major depressive disorder, single episode, severe without psychotic features: Secondary | ICD-10-CM

## 2016-03-16 DIAGNOSIS — F411 Generalized anxiety disorder: Secondary | ICD-10-CM

## 2016-03-16 NOTE — Progress Notes (Signed)
   THERAPIST PROGRESS NOTE  Session Time: 4:10-5:00pm  Participation Level: Active  Behavioral Response: NeatAlert/Somehat depressed  Type of Therapy: Individual Therapy  Treatment Goals addressed: Coping  Interventions: CBT, Strength-based and Supportive  Summary: Curtis Wood is a 55 y.o. male who presents with  Depression. Pt did not speak of his the relationship with his children today. He spoke mostly about the relationship with his wife. Pt has high expectations of what his relationship with his wife should be and she is not meeting those expectations as he desires. He had an honest adult conversation with his wife which he thought went well but the relationship has not met his desired results. Processed with pt his expectations, asking for what he wants and being ok with not getting his wants met. Problem-solved and role-played relational issues. Gave pt a packet on self-esteem to complete and bring with him next session. Pt and his wife are leaving for [place] Monday. He is excited about the trip, his wife not so much. Encouraged pt to discuss possible contentions before going on the trip. Pt is still hesitant to use journaling and meditation. Still encouraged both of them and showed pt you tube videos for guided meditation. Encouraged pt to complete a daily list of gratitude and random acts of kindness. PT was in agreement for all suggestions and actively participated in the intervention.  Suicidal/Homicidal: Nowithout intent/plan  Therapist Response: Assessed pts current functioning And reviewed progress. Assisted pt processing issues with  issues in marriage, self-esteem and  processing for management of stressors.  Plan: Return again in 2 weeks using CBT-based therapy.  Diagnosis: Axis I: F32.2, F41.1        Jenkins Rouge, LCAS

## 2016-03-20 ENCOUNTER — Encounter (HOSPITAL_COMMUNITY): Payer: Self-pay | Admitting: Licensed Clinical Social Worker

## 2016-03-28 NOTE — Telephone Encounter (Signed)
Pt urine drug screen showed thc/marijuana. It is important for him to know that if it shows again on future random drug screens I won't be able to prescribe controlled medications. This is the policy for everyone. Document he was advised.

## 2016-03-31 NOTE — Telephone Encounter (Signed)
MyChart message sent to patient making him aware.

## 2016-04-12 ENCOUNTER — Ambulatory Visit (HOSPITAL_COMMUNITY): Payer: Self-pay | Admitting: Licensed Clinical Social Worker

## 2016-04-18 ENCOUNTER — Encounter (HOSPITAL_COMMUNITY): Payer: Self-pay | Admitting: Psychiatry

## 2016-04-18 ENCOUNTER — Ambulatory Visit (INDEPENDENT_AMBULATORY_CARE_PROVIDER_SITE_OTHER): Payer: BLUE CROSS/BLUE SHIELD | Admitting: Psychiatry

## 2016-04-18 VITALS — BP 124/78 | HR 95 | Ht 73.5 in | Wt 220.6 lb

## 2016-04-18 DIAGNOSIS — F331 Major depressive disorder, recurrent, moderate: Secondary | ICD-10-CM

## 2016-04-18 MED ORDER — TRAZODONE HCL 50 MG PO TABS: ORAL_TABLET | ORAL | 0 refills | Status: DC

## 2016-04-18 MED ORDER — FLUOXETINE HCL 10 MG PO CAPS: ORAL_CAPSULE | ORAL | 0 refills | Status: DC

## 2016-04-18 NOTE — Progress Notes (Signed)
[facility] Behavioral Health Initial Assessment Note  Curtis Wood [id number] 55 y.o.  04/18/2016 10:05 AM  Chief Complaint:  I have depression.  I was referred from my primary care physician assistant.  I'm seeing therapist in this office.    History of Present Illness:  Curtis Wood is a 55 year old [place], employed, married man who is referred from his primary care physician assistant for the measurement of depression and anxiety symptoms.  Patient is also seeing a therapist in this office for coping skills.  Patient mentioned he has history of depression for more than 15 years and he has seen psychiatrist in [place] but he does not feel any improvement even though he tried multiple psychiatric medication.  History patient is started to get worse in 2016 when he have to moved from [place] to [place] due to financial stress.  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  His other stressors loss having the custody battle and legal issues" which was filed by his ex-wife.  Patient has 3 children and he feels guilty that he has noted medication with them because his children making guilty leaving them.  Patient remarried.  His current wife for more than 6 years.  He admitted due to financial problems there are times when he is very concerned about his current relationship but overall his wife has been supportive.  Patient denies any history of psychosis, mania, hallucination or any aggressive behavior.  However he admitted episodes of severe depression when he feels very isolated, withdrawn, lack of energy, feeling hopelessness and worthlessness.  He also mentioned at times having fleeting and passive suicidal thoughts but he never intent to harm himself.  He is taking Klonopin and Ambien to help sleep and anxiety symptoms.  His intention is to come off from the [place] and he tried once but have significant withdrawal symptoms.  Despite  taking Ambien he only sleeps a few hours and wake up.  He admitted feeling some time worthless and complaining of anhedonia but since he start walking almost 9010 miles a day he is feeling better.  His major concern is pain in his left knee.  Patient denies any nightmares, severe panic attack, phobias, OCD symptoms or any PTSD symptoms.  He is open to try antidepressant which she had never tried before.  So far.  Tried Wellbutrin, Celexa, Effexor, Lexapro, Abilify and Cymbalta.  Patient also admitted smoking marijuana on and off but he is from that he can quit anytime if needed.  Patient also like to continue seeing Beth for counseling in this office.  His appetite is okay.  His weight has been stable.  Suicidal Ideation: No Plan Formed: No Patient has means to carry out plan: No  Homicidal Ideation: No Plan Formed: No Patient has means to carry out plan: No  Past Psychiatric History/Hospitalization(s): Patient reported symptoms of depression for more than 15-20 years.  He has been seen psychiatrists in [place] and he had tried Celexa, Lexapro which cause weight gain, Cymbalta also cause weight gain, Abilify and Effexor which did not help him and Wellbutrin which makes him more anxious.  Patient denies any history of suicidal attempt, inpatient treatment, psychosis, mania, hallucination. Anxiety: Yes Bipolar Disorder: No Depression: Yes Mania: No Psychosis: No Schizophrenia: No Personality Disorder: No Hospitalization for psychiatric illness: No History of Electroconvulsive Shock Therapy: No Prior Suicide Attempts: No  Family History; Patient reported mother has  severe depression and takes multiple medication.  Both of his brother also have anxiety and depression.  Medical History; Patient has pain in his left knee.  He denies any history of seizures, headaches, traumatic brain injury.  Traumatic brain injury: Patient denies any history of traumatic brain injury.  Education and Work  History; Patient has college education.  He is working as a Tax adviser for [employer].  Psychosocial History; Patient born and raised in [place].  He married twice.  From his first marriage she had 3 kids who are now 53, 74 and 28 year old.  Patient has very limited contact with his children.  Patient remarried 6 years ago and his current wife is living with him.  Patient's mother also visiting from [place] and living with him.  Legal History; Patient denies any current legal issues.  History Of Abuse; Patient denies any history of abuse.  Substance Abuse History; Patient admitted history of smoking marijuana on and off and his UDS was positive for cannabis.  He denies any history of heavy drinking, intoxication, blackouts, withdrawal symptoms and seizures.  Review of Systems: Psychiatric: Agitation: No Hallucination: No Depressed Mood: Yes Insomnia: Yes Hypersomnia: No Altered Concentration: No Feels Worthless: No Grandiose Ideas: No Belief In Special Powers: No New/Increased Substance Abuse: Yes Compulsions: No  Neurologic: Headache: No Seizure: No Paresthesias: No   Outpatient Encounter Prescriptions as of 04/18/2016  Medication Sig  . clonazePAM (KLONOPIN) 1 MG tablet Take 1 tablet (1 mg total) by mouth 2 (two) times daily as needed for anxiety.  Marland Kitchen FLUoxetine (PROZAC) 10 MG capsule Take 1 capsule for 2 week and than 2 capsule daily  . traZODone (DESYREL) 50 MG tablet Take 1/2 to 1 tab as needed  . [DISCONTINUED] clonazePAM (KLONOPIN) 1 MG tablet 1 tab po q hs prn insomnia or anxiety  . [DISCONTINUED] zolpidem (AMBIEN) 10 MG tablet Take 1 tablet (10 mg total) by mouth at bedtime as needed. for sleep   No facility-administered encounter medications on file as of 04/18/2016.     No results found for this or any previous visit (from the past 2160 hour(s)).    Constitutional:  BP 124/78   Pulse 95   Ht 6' 1.5" (1.867 m)   Wt 220 lb 9.6 oz  (100.1 kg)   BMI 28.71 kg/m    Musculoskeletal: Strength & Muscle Tone: within normal limits Gait & Station: normal Patient leans: N/A  Psychiatric Specialty Exam: General Appearance: Casual and Well Groomed  Engineer, water::  Good  Speech:  Clear and Coherent  Volume:  Normal  Mood:  Anxious and Depressed  Affect:  Constricted  Thought Process:  Goal Directed  Orientation:  Full (Time, Place, and Person)  Thought Content:  Logical and Rumination  Suicidal Thoughts:  No  Homicidal Thoughts:  No  Memory:  Immediate;   Good Recent;   Good Remote;   Good  Judgement:  Good  Insight:  Good  Psychomotor Activity:  Normal  Concentration:  Good  Recall:  Good  Fund of Knowledge:  Good  Language:  Good  Akathisia:  No  Handed:  Right  AIMS (if indicated):     Assets:  Communication Skills Desire for Improvement Housing Talents/Skills Transportation  ADL's:  Intact  Cognition:  WNL  Sleep:        Review of Psycho-Social Stressors (1), Review or order clinical lab tests (1), Decision to obtain old records (1), Review and summation of old records (2), Established Problem,  Worsening (2), New Problem, with no additional work-up planned (3), Review of Medication Regimen & Side Effects (2) and Review of New Medication or Change in Dosage (2)  Assessment: Axis I: Major depressive disorder, recurrent moderate.  Anxiety disorder NOS.  Cannabis abuse.  Axis II: Deferred  Axis III:  Past Medical History:  Diagnosis Date  . Allergy   . Anxiety   . Depression   . GERD (gastroesophageal reflux disease)   . Hiatal hernia 1990's     Plan:  I review his symptoms, history, current medication, psychosocial stressors and collateral from his other providers.  Patient has never tried Prozac.  Recommended to try Prozac 10 mg for 2 weeks and then increase to 20 mg.  We like to start low-dose antidepressant since patient developed side effects in the past with a higher dosage.  Patient  also like to stop marijuana since we discuss illegal substance use causing interaction with psychotropic medication and interference with his recovery.  Patient also like to wean off from [place] and recommended the use as needed until Prozac start working.  We will discontinue Ambien and try trazodone which she had never tried before for insomnia.  Discussed medication side effects in detail and recommended to call us back if he has any question, concern if he feel worsening of the symptoms.  Patient will continue to see Beth for counseling in this office.  Discuss safety plan that anytime having active suicidal thoughts or homicidal thought and he need to call 911 or go to the local emergency room.  Follow-up in 4-6 weeks  Elisabel Hanover T., MD 04/18/2016

## 2016-04-26 ENCOUNTER — Ambulatory Visit (INDEPENDENT_AMBULATORY_CARE_PROVIDER_SITE_OTHER): Payer: BLUE CROSS/BLUE SHIELD | Admitting: Licensed Clinical Social Worker

## 2016-04-26 DIAGNOSIS — F331 Major depressive disorder, recurrent, moderate: Secondary | ICD-10-CM | POA: Diagnosis not present

## 2016-04-26 NOTE — Progress Notes (Signed)
   THERAPIST PROGRESS NOTE  Session Time: 4:10-5:00pm  Participation Level: Active  Behavioral Response: NeatAlert/Somewhat depressed  Type of Therapy: Individual Therapy  Treatment Goals addressed: Coping  Interventions: CBT, Strength-based and Supportive  Summary: Curtis Wood is a 55 y.o. male who presents with  Depression. Pt was anxious of the relationship with his wife and mother. His wife is in menopause and he is not happy. He wants her to "do something about it." Processed this with pt. Role played with pt assertive communication style instead of aggressive style her currently uses with both his mother and wife. Again, pt has high expectations of what his relationship with his wife should be and she is not meeting those expectations as he desires.Processed again with pt his expectations, asking for what he wants and being ok with not getting his wants met. Also processed with pt his use of boundaries with his mother who says mean things to him and his realistic expectations of their relationship.  Problem-solved and role-played relational issues.Pt and his wife went to [place] for about 2weeks and really enjoyed the trip. Pt met with the psychiatrist. PT was in agreement for all suggestions and actively participated in the intervention.  Suicidal/Homicidal: Nowithout intent/plan  Therapist Response: Assessed pts current functioning And reviewed progress. Assisted pt processing issues with  issues in marriage, family, communication styles and processing for management of stressors.  Plan: Return again in 2 weeks using CBT-based therapy.  Diagnosis: Axis I: F32.2, F41.1        Jenkins Rouge, LCAS 04/26/16

## 2016-04-27 ENCOUNTER — Encounter (HOSPITAL_COMMUNITY): Payer: Self-pay | Admitting: Licensed Clinical Social Worker

## 2016-04-27 ENCOUNTER — Telehealth (HOSPITAL_COMMUNITY): Payer: Self-pay | Admitting: *Deleted

## 2016-04-27 NOTE — Telephone Encounter (Signed)
Prior authorization for Fluoxentine receive. Called [phone] spoke with [employer] who gave approval until 04/27/17 [id number]. Called to notify pharmacy, it went through without problems.

## 2016-05-02 ENCOUNTER — Other Ambulatory Visit: Payer: Self-pay | Admitting: Medical

## 2016-05-02 NOTE — Telephone Encounter (Signed)
Received request from [facility] on [address] in [place], [place] on 05/02/2016. Please clarify the Diclofenac 75mg  EC tab prescription; written for twice daily.

## 2016-05-02 NOTE — Telephone Encounter (Signed)
I refilled diclofenac. But regarding klonopin. I want to know how he is taking. Does he have refill? I wrote him for rx on 03-13-2016 30 tabs with 2 refills. Does he have refill left. If he has refill left. I am thinking he should have refill. Maybe check with he pharmacy.

## 2016-05-03 ENCOUNTER — Encounter: Payer: Self-pay | Admitting: Medical

## 2016-05-03 ENCOUNTER — Ambulatory Visit (INDEPENDENT_AMBULATORY_CARE_PROVIDER_SITE_OTHER): Payer: BLUE CROSS/BLUE SHIELD | Admitting: Medical

## 2016-05-03 VITALS — BP 118/78 | HR 87 | Temp 98.9°F | Ht 75.0 in | Wt 219.6 lb

## 2016-05-03 DIAGNOSIS — J0100 Acute maxillary sinusitis, unspecified: Secondary | ICD-10-CM

## 2016-05-03 DIAGNOSIS — G8929 Other chronic pain: Secondary | ICD-10-CM | POA: Diagnosis not present

## 2016-05-03 DIAGNOSIS — L089 Local infection of the skin and subcutaneous tissue, unspecified: Secondary | ICD-10-CM | POA: Diagnosis not present

## 2016-05-03 DIAGNOSIS — M25562 Pain in left knee: Secondary | ICD-10-CM

## 2016-05-03 MED ORDER — FLUTICASONE PROPIONATE 50 MCG/ACT NA SUSP: 2.0000 | Freq: Every day | NASAL | 1 refills | Status: DC

## 2016-05-03 MED ORDER — DOXYCYCLINE HYCLATE 100 MG PO TABS: 100.0000 mg | ORAL_TABLET | Freq: Two times a day (BID) | ORAL | 0 refills | Status: DC

## 2016-05-03 MED ORDER — MUPIROCIN 2 % EX OINT
TOPICAL_OINTMENT | CUTANEOUS | 1 refills | Status: DC
Start: 2016-05-03 — End: 2016-07-26

## 2016-05-03 NOTE — Patient Instructions (Addendum)
For sinus infection will rx flonase nasal spray and rx doxycycline(advisement given on how to best tolerate). If too many GI symptoms stop and notify us.  For skin infection will also rx mupirocin.   If the skin area does not clear then will refer to dermatologist.  For your anxiety get your klonopin refill. If you run out of med before you see your psychiatrist please let us know.  Will get knee xray today. Assess joint space. Can take diclofenac if needed. May refer to orthopedist.  Follow up in 10-14 days or as needed(if not completley better)

## 2016-05-03 NOTE — Progress Notes (Signed)
Pre visit review using our clinic review tool, if applicable. No additional management support is needed unless otherwise documented below in the visit note. 

## 2016-05-03 NOTE — Progress Notes (Signed)
Subjective:    Patient ID: Curtis Wood, male    DOB: 1960/10/28, 55 y.o.   MRN: [id number]  HPI   Pt in with some sinus pressure over past month. He has history of sinus infection. He states some yellow mucous when he blows his nose. Mild st. Some brown mucous when he clears throat. On and off mild st every other day. Some sneezing.   Pt had one month of small red area on rt side abdomen at level of his umbilicus. Pt states no itch. He speculates bite but not sure. No known history of insect bite(he did not see any insect or tick). Tried triple antibiotic ointment and did not help.     Review of Systems  Constitutional: Negative for chills, fatigue and fever.  HENT: Positive for congestion, sinus pain, sinus pressure and sneezing. Negative for postnasal drip, rhinorrhea and sore throat.   Respiratory: Negative for cough, chest tightness, shortness of breath and wheezing.   Cardiovascular: Negative for chest pain and palpitations.  Gastrointestinal: Negative for abdominal pain.  Musculoskeletal: Negative for back pain.       At very end of exam as leaving to get avs. He mentioned left knee pain. Pt feels like may have arthritis. Crepitus and some daily pain.  Hx of acl repair and meniscus surgery.  Skin: Negative for rash.  Neurological: Negative for dizziness and headaches.  Hematological: Negative for adenopathy. Does not bruise/bleed easily.  Psychiatric/Behavioral: Positive for dysphoric mood. Negative for behavioral problems, confusion, hallucinations and suicidal ideas. The patient is nervous/anxious.        Mood and anxiety stable.(seeing psychiatrist)  Stable and will pick up his rx of klonopin.    Past Medical History:  Diagnosis Date  . Allergy   . Anxiety   . Depression   . GERD (gastroesophageal reflux disease)   . Hiatal hernia 1990's     Social History   Social History  . Marital status: Married    Spouse name: N/A  . Number of children: N/A  . Years  of education: N/A   Occupational History  . Not on file.   Social History Main Topics  . Smoking status: Never Smoker  . Smokeless tobacco: Never Used  . Alcohol use 0.0 oz/week     Comment: occasional wine   . Drug use: No  . Sexual activity: Not on file   Other Topics Concern  . Not on file   Social History Narrative  . No narrative on file    Past Surgical History:  Procedure Laterality Date  . Acl replacement Left   . CHOLECYSTECTOMY    . surgery for pigeon breast repair      Family History  Problem Relation Age of Onset  . Colon cancer Neg Hx     No Known Allergies  Current Outpatient Prescriptions on File Prior to Visit  Medication Sig Dispense Refill  . clonazePAM (KLONOPIN) 1 MG tablet Take 1 tablet (1 mg total) by mouth 2 (two) times daily as needed for anxiety. 30 tablet 2  . diclofenac (VOLTAREN) 75 MG EC tablet TAKE 1 TABLET(75 MG) BY MOUTH TWICE DAILY 30 tablet 0  . FLUoxetine (PROZAC) 10 MG capsule Take 1 capsule for 2 week and than 2 capsule daily 60 capsule 0  . traZODone (DESYREL) 50 MG tablet Take 1/2 to 1 tab as needed 30 tablet 0   No current facility-administered medications on file prior to visit.     BP 118/78 (BP Location:  Left Arm, Patient Position: Sitting, Cuff Size: Large)   Pulse 87   Temp 98.9 F (37.2 C) (Oral)   Ht 6\' 3"  (1.905 m)   Wt 219 lb 9.6 oz (99.6 kg)   SpO2 98%   BMI 27.45 kg/m       Objective:   Physical Exam  General  Mental Status - Alert. General Appearance - Well groomed. Not in acute distress.  Skin About 2 inches from umbilicus rt side small red area about 1 cm in diameter. No induration. No warmth and not tender. Center follicle appears mild inflamed on inspection.  HEENT Head- Normal. Ear Auditory Canal - Left- Normal. Right - Normal.Tympanic Membrane- Left- Normal. Right- Normal. Eye Sclera/Conjunctiva- Left- Normal. Right- Normal. Nose & Sinuses Nasal Mucosa- Left-  Boggy and Congested. Right-   Boggy and  Congested.Bilateral maxillary and frontal sinus pressure. Mouth & Throat Lips: Upper Lip- Normal: no dryness, cracking, pallor, cyanosis, or vesicular eruption. Lower Lip-Normal: no dryness, cracking, pallor, cyanosis or vesicular eruption. Buccal Mucosa- Bilateral- No Aphthous ulcers. Oropharynx- No Discharge or Erythema. Tonsils: Characteristics- Bilateral- No Erythema or Congestion. Size/Enlargement- Bilateral- No enlargement. Discharge- bilateral-None.  Neck Neck- Supple. No Masses.   Chest and Lung Exam Auscultation: Breath Sounds:-Clear even and unlabored.  Cardiovascular Auscultation:Rythm- Regular, rate and rhythm. Murmurs & Other Heart Sounds:Ausculatation of the heart reveal- No Murmurs.  Lymphatic Head & Neck General Head & Neck Lymphatics: Bilateral: Description- No Localized lymphadenopathy.  Lt knee- moderate crepitus and faint pain on range of motion.     Assessment & Plan:  For sinus infection will rx flonase nasal spray and rx doxycycline(advisement given on how to best tolerate). If too many GI symptoms stop and notify us.  For skin infection will also rx mupirocin.   If the skin area does not clear then will refer to dermatologist.  For your anxiety get your klonopin refill. If you run out of med before you see your psychiatrist please let us know.  Will get knee xray today. Assess joint space. Can take diclofenac if needed.May refer to orthopedist.  Follow up in 10-14 days or as needed(if not completley better)

## 2016-05-04 NOTE — Telephone Encounter (Signed)
Will you call pt pharmacy. I sent in rx of clonazepam on 03-13-2016 with 2 refills. Pt states he only refilled med one time and has one remaining refill. Will you look at my last rx and then call his pharmacy to investigate what issue is. Sometimes refills on our script don't match theres then let me know what they say. Will you call them tomorrow on Friday before the weekend.

## 2016-05-05 NOTE — Telephone Encounter (Signed)
Pt rx for clonazapam is ready. Pharmacist stated he was getting denial on automated service. Maybe he was pressing wrong number?

## 2016-05-05 NOTE — Telephone Encounter (Signed)
Called pharmacy. They verified that there was one more refill on the clonazepam 1 mg at the pharmacy.  Message sent to patient making him aware.

## 2016-05-05 NOTE — Telephone Encounter (Signed)
Called pharmacy and verified that patient does have a refill on clonazepam.  MyChart message sent to patient making him aware.

## 2016-05-08 ENCOUNTER — Ambulatory Visit (HOSPITAL_BASED_OUTPATIENT_CLINIC_OR_DEPARTMENT_OTHER)
Admission: RE | Admit: 2016-05-08 | Discharge: 2016-05-08 | Disposition: A | Discharge status: Home / Self Care | Payer: BLUE CROSS/BLUE SHIELD | Source: Ambulatory Visit | Attending: Medical | Admitting: Medical

## 2016-05-08 DIAGNOSIS — G8929 Other chronic pain: Secondary | ICD-10-CM | POA: Diagnosis present

## 2016-05-08 DIAGNOSIS — M25562 Pain in left knee: Secondary | ICD-10-CM

## 2016-05-08 DIAGNOSIS — M1712 Unilateral primary osteoarthritis, left knee: Secondary | ICD-10-CM | POA: Diagnosis not present

## 2016-05-08 NOTE — Telephone Encounter (Signed)
Referral made to ortho

## 2016-05-10 ENCOUNTER — Encounter: Payer: Self-pay | Admitting: Medical

## 2016-05-10 ENCOUNTER — Telehealth: Payer: Self-pay | Admitting: Medical

## 2016-05-10 DIAGNOSIS — G8929 Other chronic pain: Secondary | ICD-10-CM

## 2016-05-10 DIAGNOSIS — M25562 Pain in left knee: Principal | ICD-10-CM

## 2016-05-10 NOTE — Telephone Encounter (Signed)
Sports med referral placed.

## 2016-05-11 NOTE — Progress Notes (Signed)
Pt has seen results on MyChart and message also sent for patient to call back if any questions.

## 2016-05-12 ENCOUNTER — Ambulatory Visit (INDEPENDENT_AMBULATORY_CARE_PROVIDER_SITE_OTHER): Payer: BLUE CROSS/BLUE SHIELD | Admitting: Family Medicine

## 2016-05-12 ENCOUNTER — Encounter: Payer: Self-pay | Admitting: Family Medicine

## 2016-05-12 DIAGNOSIS — M2352 Chronic instability of knee, left knee: Secondary | ICD-10-CM | POA: Diagnosis not present

## 2016-05-12 NOTE — Patient Instructions (Signed)
These are the typical arthritis instructions though most of these apply to pain which is not your major problem: Tylenol 500mg  1-2 tabs three times a day for pain. Aleve 1-2 tabs twice a day with food Glucosamine sulfate 750mg  twice a day is a supplement that may help. Capsaicin, aspercreme, or biofreeze topically up to four times a day may also help with pain. Cortisone injections are an option. If cortisone injections do not help, there are different types of shots that may help but they take longer to take effect. It's important that you continue to stay active. Straight leg raises, knee extensions, hamstring curls, hamstring swings, half-squats 3 sets of 10 once a day (add ankle weight if these become too easy). Avoid deep squats, deep lunges, leg press, running. Consider physical therapy to strengthen muscles around the joint that hurts to take pressure off of the joint itself. Shoe inserts with good arch support may be helpful. Heat or ice 15 minutes at a time 3-4 times a day as needed to help with pain. Water aerobics and cycling are the best two types of exercise. Follow up with me in 6 weeks.

## 2016-05-15 ENCOUNTER — Other Ambulatory Visit (HOSPITAL_COMMUNITY): Payer: Self-pay | Admitting: Psychiatry

## 2016-05-15 ENCOUNTER — Ambulatory Visit (INDEPENDENT_AMBULATORY_CARE_PROVIDER_SITE_OTHER): Payer: BLUE CROSS/BLUE SHIELD | Admitting: Licensed Clinical Social Worker

## 2016-05-15 DIAGNOSIS — F331 Major depressive disorder, recurrent, moderate: Secondary | ICD-10-CM

## 2016-05-15 NOTE — Progress Notes (Signed)
   THERAPIST PROGRESS NOTE  Session Time: 4:10-5:00pm  Participation Level: Active  Behavioral Response: NeatAlert/depressed  Type of Therapy: Individual Therapy  Treatment Goals addressed: Coping  Interventions: CBT, Strength-based and Supportive  Summary: Curtis Wood is a 55 y.o. male who presents with  Depression. Pt appeared more depressed than usual. He and his wife are having marital stress and he is unsure if he wants to stay in the marriage. His wife says she is unhappy in the marriage and wants to move back to [place] where her family lives. Pt is angry and throws "jabs" at her being passive aggressive and doesn't want to have a conversation about the state of the marriage. Pt shares his wife has been very confusing about her desires of the marriage and suggested to the pt that he communicate with her. Role played  With pt assertive communication styles to use with his wife in conversation. Pt appears frustrated with the state of his marriage and just would rather give up. He doesn't want to be alone but he doesn't want to be in a loveless marriage either. Pt has high expectations of what his relationship with his wife should be and she is not meeting those expectations as he desires.Processed again with pt his expectations, asking for what he wants and being ok with not getting his wants met.  Problem-solved and role-played relational issues. Pt was in agreement for all suggestions and actively participated in the intervention.  Suicidal/Homicidal: Nowithout intent/plan  Therapist Response: Assessed pts current functioning And reviewed progress. Assisted pt processing issues with  issues in marriage, family, communication styles and processing for management of stressors.  Plan: Return again in 2 weeks using CBT-based therapy.  Diagnosis: Axis I: F32.2, F41.1        Jenkins Rouge, LCAS 05/15/16

## 2016-05-16 DIAGNOSIS — M2352 Chronic instability of knee, left knee: Secondary | ICD-10-CM | POA: Insufficient documentation
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
## 2016-05-16 NOTE — Assessment & Plan Note (Signed)
structurally this knee is sound and exam is reassuring.  He does have quad atrophy, weakness which is likely cause of his symptoms.  Also likely with underlying arthritis given his prior injuries and surgeries.  We reviewed strengthening exercises to do daily as primary part of his treatment.  He can revisit doing physical therapy - will think about this.  We discussed tylenol, nsaids if pain becomes an issue.  Sleeve if needed.  Heat/ice.  F/u in 6 weeks.

## 2016-05-17 ENCOUNTER — Encounter (HOSPITAL_COMMUNITY): Payer: Self-pay | Admitting: Licensed Clinical Social Worker

## 2016-05-17 NOTE — Telephone Encounter (Signed)
Met with Dr. Adele Schilder who approved a new one time refill of patient's Trazodone.  New one time order e-scribed to patient's [facility] on [address] as approved by Dr. Adele Schilder.  Patient returns for eval on 05/30/16.

## 2016-05-30 ENCOUNTER — Ambulatory Visit (HOSPITAL_COMMUNITY): Payer: Self-pay | Admitting: Psychiatry

## 2016-06-04 ENCOUNTER — Other Ambulatory Visit: Payer: Self-pay | Admitting: Medical

## 2016-06-08 ENCOUNTER — Other Ambulatory Visit: Payer: Self-pay

## 2016-06-08 MED ORDER — ZOLPIDEM TARTRATE 10 MG PO TABS: ORAL_TABLET | ORAL | 2 refills | Status: DC

## 2016-06-08 NOTE — Telephone Encounter (Signed)
Pt can pick up his rx of ambien or you can fax to his pharmacy.

## 2016-06-08 NOTE — Telephone Encounter (Signed)
Rx faxed to [facility] pharmacy.  

## 2016-06-08 NOTE — Telephone Encounter (Signed)
Received refill request for Ambien 10mg  tablets (take 1 tab po qhs prn for sleep).  Last Rf: 05/08/2016 Last Ov: 12/07/05/2015 No scheduled appointment at this time UDS: 03/13/16 controlled substance contract signed, uds sample given, moderate risk next screen 06/13/16.  Forwarded to the Provider for review, approval or denial.

## 2016-06-12 ENCOUNTER — Telehealth (HOSPITAL_COMMUNITY): Payer: Self-pay

## 2016-06-12 ENCOUNTER — Ambulatory Visit (INDEPENDENT_AMBULATORY_CARE_PROVIDER_SITE_OTHER): Payer: BLUE CROSS/BLUE SHIELD | Admitting: Licensed Clinical Social Worker

## 2016-06-12 DIAGNOSIS — F331 Major depressive disorder, recurrent, moderate: Secondary | ICD-10-CM | POA: Diagnosis not present

## 2016-06-12 NOTE — Progress Notes (Signed)
   THERAPIST PROGRESS NOTE  Session Time: 4:10-5:00pm  Participation Level: Active  Behavioral Response: NeatAlert/depressed  Type of Therapy: Individual Therapy  Treatment Goals addressed: Coping  Interventions: CBT, Strength-based and Supportive  Summary: Curtis Wood is a 56 y.o. male who presents with  Depression. Pt appeared more depressed than usual. Pt shared it was "rough" over the holidays. He is just depressed and questioned if he had been depressed all of his life. Discussed medication with the pt and he is willing to try another anti-depressant. He reports that he has tried so many different anti-depressants in the past and none have really worked. He appeared hopeless about medication.  Assured pt that medications have changed in the past 20 years and reminded pt of appt in February with psychiatrist, where he will be able to discuss medications.  Pt still feels hopeless about his marriage. Re-processed communciation styles, expectations.  Once again pt voiced his frustration with the state of his marriage and whether he should give up. He doesn't want to be alone but he doesn't want to be in a loveless marriage either. Pt has high expectations of what his relationship with his wife should be and she is not meeting those expectations as he desires.Processed again with pt his expectations, asking for what he wants and being ok with not getting his wants met.  Problem-solved and role-played relational issues. Pt was in agreement for all suggestions and actively participated in the intervention.  Suicidal/Homicidal: Nowithout intent/plan  Therapist Response: Assessed pts current functioning And reviewed progress. Assisted pt processing issues with  issues in marriage, family, communication styles and processing for management of stressors.  Plan: Return again in 2 weeks using CBT-based therapy.  Diagnosis: Axis I: F32.2, F41.1        Jenkins Rouge, LCAS 06/12/16

## 2016-06-12 NOTE — Telephone Encounter (Signed)
Patient came in to see you in November and was started on Prozac, in his session with Eustaquio Maize today he told her he did not like the way it made him feel and stopped taking it. Now patient is depressed and he had to be rescheduled so he is frustrated about that. Please review and advise, thank you

## 2016-06-13 ENCOUNTER — Encounter (HOSPITAL_COMMUNITY): Payer: Self-pay | Admitting: Psychiatry

## 2016-06-13 ENCOUNTER — Ambulatory Visit (INDEPENDENT_AMBULATORY_CARE_PROVIDER_SITE_OTHER): Payer: BLUE CROSS/BLUE SHIELD | Admitting: Psychiatry

## 2016-06-13 DIAGNOSIS — Z79899 Other long term (current) drug therapy: Secondary | ICD-10-CM | POA: Diagnosis not present

## 2016-06-13 DIAGNOSIS — Z9049 Acquired absence of other specified parts of digestive tract: Secondary | ICD-10-CM

## 2016-06-13 DIAGNOSIS — F331 Major depressive disorder, recurrent, moderate: Secondary | ICD-10-CM | POA: Diagnosis not present

## 2016-06-13 DIAGNOSIS — Z9889 Other specified postprocedural states: Secondary | ICD-10-CM

## 2016-06-13 MED ORDER — HYDROXYZINE PAMOATE 25 MG PO CAPS: ORAL_CAPSULE | ORAL | 0 refills | Status: DC

## 2016-06-13 MED ORDER — LAMOTRIGINE 25 MG PO TABS: ORAL_TABLET | ORAL | 1 refills | Status: DC

## 2016-06-13 MED ORDER — TRAZODONE HCL 100 MG PO TABS: ORAL_TABLET | ORAL | 0 refills | Status: DC

## 2016-06-13 NOTE — Telephone Encounter (Signed)
Patient is coming in this afternoon.

## 2016-06-13 NOTE — Progress Notes (Signed)
BH MD/PA/NP OP Progress Note  06/13/2016 5:03 PM Curtis Wood  MRN:  [id number]  Chief Complaint:  Subjective:  I stop Prozac.  It was giving me side effects.  I was feeling dizzy.  I did try a different medication.  I'm feeling sad and depressed.  HPI: Curtis Wood came for his follow-up appointment.  He is a 56 year old [place] married man who was seen first time on no Amer 21st.  We started him on Prozac but patient does not like it.  He felt dizzy and tired.  Patient told he has been struggling with depression for more than 15 years and he had tried multiple antidepressant with limited effect.  He is taking Ambien and trazodone and sleeping only a few hours.  He admitted stress from the work.  He regret about his past because he had lost a lot of money in the business.  He is working as a Soil scientist and he admitted a lot of irritability and frustration with his job.  He endorsed financial issues and sometime is very concerned about his future.  Though he denies any suicidal thoughts but sometime feeling lack of motivation, anhedonia, hopelessness and no motivation to do things.  He endorse isolated, withdrawn and easily fatigued.  He recently started swimming to help his depression.  He denies any adenoid or any hallucination.  He is taking Ambien, trazodone and Klonopin.  His Klonopin is given by his primary care physician.  Patient denies drinking alcohol but he continues to smoke marijuana on and off.  He denies withdrawal symptoms.  He lives with his wife.  He has a very limited contact with his children who are from his first marriage.  Visit Diagnosis:    ICD-9-CM ICD-10-CM   1. Moderate episode of recurrent major depressive disorder (HCC) 296.32 F33.1 traZODone (DESYREL) 100 MG tablet     lamoTRIgine (LAMICTAL) 25 MG tablet    Past Psychiatric History:  Patient reported symptoms of depression for more than 15-20 years.  He has been seen psychiatrists in [place] and he had tried Celexa,  Lexapro which cause weight gain, Cymbalta also cause weight gain, Abilify and Effexor which did not help him and Wellbutrin which makes him more anxious.  Patient denies any history of suicidal attempt, inpatient treatment, psychosis, mania, hallucination.  Past Medical History:  Past Medical History:  Diagnosis Date  . Allergy   . Anxiety   . Depression   . GERD (gastroesophageal reflux disease)   . Hiatal hernia 1990's    Past Surgical History:  Procedure Laterality Date  . Acl replacement Left   . CHOLECYSTECTOMY    . surgery for pigeon breast repair      Family Psychiatric History: Reviewed.  Family History:  Family History  Problem Relation Age of Onset  . Colon cancer Neg Hx     Social History:  Social History   Social History  . Marital status: Married    Spouse name: N/A  . Number of children: N/A  . Years of education: N/A   Social History Main Topics  . Smoking status: Never Smoker  . Smokeless tobacco: Never Used  . Alcohol use 0.0 oz/week     Comment: occasional wine   . Drug use: No  . Sexual activity: Not on file   Other Topics Concern  . Not on file   Social History Narrative  . No narrative on file    Allergies: No Known Allergies  Metabolic Disorder Labs: No results found  for: HGBA1C, MPG No results found for: PROLACTIN Lab Results  Component Value Date   CHOL 171 06/28/2015   TRIG 41.0 06/28/2015   HDL 55.70 06/28/2015   CHOLHDL 3 06/28/2015   VLDL 8.2 06/28/2015   LDLCALC 107 (H) 06/28/2015     Current Medications: Current Outpatient Prescriptions  Medication Sig Dispense Refill  . clonazePAM (KLONOPIN) 1 MG tablet Take 1 tablet (1 mg total) by mouth 2 (two) times daily as needed for anxiety. 30 tablet 2  . diclofenac (VOLTAREN) 75 MG EC tablet TAKE 1 TABLET(75 MG) BY MOUTH TWICE DAILY 30 tablet 0  . doxycycline (VIBRA-TABS) 100 MG tablet Take 1 tablet (100 mg total) by mouth 2 (two) times daily. Can give caps or generic 20  tablet 0  . fluticasone (FLONASE) 50 MCG/ACT nasal spray Place 2 sprays into both nostrils daily. 16 g 1  . FLUVIRIN 0.5 ML SUSY ADM 0.5ML IM UTD  0  . lamoTRIgine (LAMICTAL) 25 MG tablet Take 1 tab daily for 1 week and than 2 tab daily 60 tablet 1  . mupirocin ointment (BACTROBAN) 2 % Apply to area twice a day 22 g 1  . traZODone (DESYREL) 100 MG tablet TAKE 1/2 TO 1 TABLET DAILY BY MOUTH AS NEEDED 30 tablet 0   No current facility-administered medications for this visit.     Neurologic: Headache: No Seizure: No Paresthesias: No  Musculoskeletal: Strength & Muscle Tone: within normal limits Gait & Station: normal Patient leans: N/A  Psychiatric Specialty Exam: Review of Systems  Constitutional: Negative.   HENT: Negative.   Gastrointestinal: Negative.   Genitourinary: Negative.   Musculoskeletal: Positive for back pain and joint pain.  Skin: Negative.   Neurological: Negative.   Psychiatric/Behavioral: Positive for depression. The patient is nervous/anxious and has insomnia.     There were no vitals taken for this visit.There is no height or weight on file to calculate BMI.  General Appearance: Casual  Eye Contact:  Good  Speech:  Clear and Coherent  Volume:  Normal  Mood:  Anxious and Depressed  Affect:  Constricted and Depressed  Thought Process:  Goal Directed  Orientation:  Full (Time, Place, and Person)  Thought Content: Rumination   Suicidal Thoughts:  No  Homicidal Thoughts:  No  Memory:  Immediate;   Good Recent;   Good Remote;   Good  Judgement:  Good  Insight:  Good  Psychomotor Activity:  Normal  Concentration:  Concentration: Good and Attention Span: Good  Recall:  Good  Fund of Knowledge: Good  Language: Good  Akathisia:  No  Handed:  Right  AIMS (if indicated):  0  Assets:  Communication Skills Desire for Improvement Financial Resources/Insurance Housing Resilience  ADL's:  Intact  Cognition: WNL  Sleep:  fair   Assessment: Major  depressive disorder, recurrent moderate.  Anxiety disorder NOS.  Cannabis abuse.  Plan: I review his symptoms, current medication.  I would discontinue Prozac since patient developed side effects.  We will try Lamictal 25 mg daily for 1 week and then 50 mg daily.  In the past she had tried multiple antidepressant with limited response.  I also recommended to discontinue Ambien and try higher dose of trazodone and if he still cannot sleep then he can supplement with Vistaril.  We discussed benzodiazepine tolerance withdrawal and abuse.  He is taking Klonopin prescribed by his primary care physician.  He promised that he will stop using cannabis.  Continue counseling with Beth. Discussed medication side effects  and benefits.  Discussed that Lamictal can cause rash and in that case he needed to stop the medication immediately.  Recommended to call us back if there is any question, concern or worsening of the symptoms.  Discuss safety plan that anytime having active suicidal thoughts or homicidal thoughts and she need to call 911 or go to the local emergency room.  Follow-up in 4 weeks.  Nathaneil Feagans T., MD 06/13/2016, 5:03 PM

## 2016-06-19 ENCOUNTER — Encounter (HOSPITAL_COMMUNITY): Payer: Self-pay | Admitting: Licensed Clinical Social Worker

## 2016-07-01 ENCOUNTER — Other Ambulatory Visit (HOSPITAL_COMMUNITY): Payer: Self-pay | Admitting: Psychiatry

## 2016-07-01 ENCOUNTER — Other Ambulatory Visit: Payer: Self-pay | Admitting: Medical

## 2016-07-01 DIAGNOSIS — F331 Major depressive disorder, recurrent, moderate: Secondary | ICD-10-CM

## 2016-07-04 ENCOUNTER — Ambulatory Visit (HOSPITAL_COMMUNITY): Payer: Self-pay | Admitting: Psychiatry

## 2016-07-04 ENCOUNTER — Ambulatory Visit: Payer: Self-pay | Admitting: Internal Medicine

## 2016-07-06 NOTE — Telephone Encounter (Signed)
Klonopin rx printed. If you get off printer I will sign and we can fax or pt can pick up rx.

## 2016-07-07 NOTE — Telephone Encounter (Signed)
Rx request faxed to pharmacy/[facility]  

## 2016-07-11 ENCOUNTER — Ambulatory Visit (INDEPENDENT_AMBULATORY_CARE_PROVIDER_SITE_OTHER): Payer: BLUE CROSS/BLUE SHIELD | Admitting: Psychiatry

## 2016-07-11 ENCOUNTER — Encounter (HOSPITAL_COMMUNITY): Payer: Self-pay | Admitting: Psychiatry

## 2016-07-11 VITALS — BP 130/84 | HR 74 | Ht 73.5 in | Wt 222.0 lb

## 2016-07-11 DIAGNOSIS — Z79899 Other long term (current) drug therapy: Secondary | ICD-10-CM

## 2016-07-11 DIAGNOSIS — F331 Major depressive disorder, recurrent, moderate: Secondary | ICD-10-CM | POA: Diagnosis not present

## 2016-07-11 DIAGNOSIS — Z9889 Other specified postprocedural states: Secondary | ICD-10-CM

## 2016-07-11 DIAGNOSIS — Z9049 Acquired absence of other specified parts of digestive tract: Secondary | ICD-10-CM

## 2016-07-11 MED ORDER — LAMOTRIGINE 100 MG PO TABS: 100.0000 mg | ORAL_TABLET | Freq: Every day | ORAL | 1 refills | Status: DC

## 2016-07-11 MED ORDER — HYDROXYZINE PAMOATE 50 MG PO CAPS: ORAL_CAPSULE | ORAL | 1 refills | Status: DC

## 2016-07-11 NOTE — Progress Notes (Signed)
BH MD/PA/NP OP Progress Note  07/11/2016 4:36 PM Curtis Wood  MRN:  [id number]  Chief Complaint:  Subjective:  I like Lamictal.  I'm feeling less depressed but I still have difficulty sleeping.  HPI: Curtis Wood came for his follow-up appointment.  He is not taking Lamictal 50 mg and denies any side effects including any tremors, shakes, rash or any itching.  He really liked Lamictal because it is helping his mood, irritability and depression.  However he still struggle for sleep.  Despite taking Klonopin, trazodone and recently Vistaril he continues to get only a few hours.  He admitted most of the stress coming from the work but he has no choice to continue at his present work.  Since started Lamictal he is less irritable and he has more motivation and is more hopeful.  He denies any suicidal thoughts or homicidal thought.  He does not feel trazodone working as good but he is hoping to try increased dose of Vistaril to help sleep.  In the past he had taken Ambien but it make him dizzy.  His energy level is better.  He denies hallucination, paranoia or any self abusive behavior.  He lives with his wife.  Visit Diagnosis:    ICD-9-CM ICD-10-CM   1. Moderate episode of recurrent major depressive disorder (HCC) 296.32 F33.1 lamoTRIgine (LAMICTAL) 100 MG tablet     hydrOXYzine (VISTARIL) 50 MG capsule    Past Psychiatric History: Reviewed. Patient reported symptoms of depression for more than 15-20 years. He has been seen psychiatrists in [place] and he had tried Celexa, Lexapro which cause weight gain, Cymbalta also cause weight gain, Abilify and Effexor which did not help him and Wellbutrin which makes him more anxious. Patient denies any history of suicidal attempt, inpatient treatment, psychosis, mania, hallucination.  Past Medical History:  Past Medical History:  Diagnosis Date  . Allergy   . Anxiety   . Depression   . GERD (gastroesophageal reflux disease)   . Hiatal hernia 1990's    Past Surgical History:  Procedure Laterality Date  . Acl replacement Left   . CHOLECYSTECTOMY    . surgery for pigeon breast repair      Family Psychiatric History: Reviewed.  Family History:  Family History  Problem Relation Age of Onset  . Colon cancer Neg Hx     Social History:  Social History   Social History  . Marital status: Married    Spouse name: N/A  . Number of children: N/A  . Years of education: N/A   Social History Main Topics  . Smoking status: Never Smoker  . Smokeless tobacco: Never Used  . Alcohol use 0.0 oz/week     Comment: occasional wine   . Drug use: No  . Sexual activity: Not on file   Other Topics Concern  . Not on file   Social History Narrative  . No narrative on file    Allergies: No Known Allergies  Metabolic Disorder Labs: No results found for: HGBA1C, MPG No results found for: PROLACTIN Lab Results  Component Value Date   CHOL 171 06/28/2015   TRIG 41.0 06/28/2015   HDL 55.70 06/28/2015   CHOLHDL 3 06/28/2015   VLDL 8.2 06/28/2015   LDLCALC 107 (H) 06/28/2015     Current Medications: Current Outpatient Prescriptions  Medication Sig Dispense Refill  . clonazePAM (KLONOPIN) 0.5 MG tablet TAKE 1 TABLET BY MOUTH EVERY NIGHT AT BEDTIME 30 tablet 0  . clonazePAM (KLONOPIN) 1 MG tablet Take 1  tablet (1 mg total) by mouth 2 (two) times daily as needed for anxiety. 30 tablet 2  . diclofenac (VOLTAREN) 75 MG EC tablet TAKE 1 TABLET(75 MG) BY MOUTH TWICE DAILY 30 tablet 0  . doxycycline (VIBRA-TABS) 100 MG tablet Take 1 tablet (100 mg total) by mouth 2 (two) times daily. Can give caps or generic 20 tablet 0  . fluticasone (FLONASE) 50 MCG/ACT nasal spray Place 2 sprays into both nostrils daily. 16 g 1  . FLUVIRIN 0.5 ML SUSY ADM 0.5ML IM UTD  0  . hydrOXYzine (VISTARIL) 25 MG capsule Take 1-2 capsule at bed time 45 capsule 0  . lamoTRIgine (LAMICTAL) 25 MG tablet Take 1 tab daily for 1 week and than 2 tab daily 60 tablet 1  .  mupirocin ointment (BACTROBAN) 2 % Apply to area twice a day 22 g 1  . traZODone (DESYREL) 100 MG tablet TAKE 1/2 TO 1 TABLET DAILY BY MOUTH AS NEEDED 30 tablet 0   No current facility-administered medications for this visit.     Neurologic: Headache: No Seizure: No Paresthesias: No  Musculoskeletal: Strength & Muscle Tone: within normal limits Gait & Station: normal Patient leans: N/A  Psychiatric Specialty Exam: Review of Systems  Constitutional: Negative.   HENT: Negative.   Musculoskeletal: Positive for back pain.  Skin: Negative.  Negative for itching and rash.  Psychiatric/Behavioral: The patient is nervous/anxious and has insomnia.     Blood pressure 130/84, pulse 74, height 6' 1.5" (1.867 m), weight 222 lb (100.7 kg).Body mass index is 28.89 kg/m.  General Appearance: Casual  Eye Contact:  Good  Speech:  Clear and Coherent  Volume:  Normal  Mood:  Anxious and Depressed  Affect:  Constricted  Thought Process:  Goal Directed  Orientation:  Full (Time, Place, and Person)  Thought Content: Rumination   Suicidal Thoughts:  No  Homicidal Thoughts:  No  Memory:  Immediate;   Fair Recent;   Good Remote;   Good  Judgement:  Good  Insight:  Good  Psychomotor Activity:  Normal  Concentration:  Concentration: Good and Attention Span: Good  Recall:  Good  Fund of Knowledge: Good  Language: Good  Akathisia:  No  Handed:  Right  AIMS (if indicated):  0  Assets:  Communication Skills Desire for Improvement Physical Health Resilience  ADL's:  Intact  Cognition: WNL  Sleep:  fair   Assessment: Major depressive disorder, recurrent  Plan: Discuss psychosocial stressors and recommended to increase Lamictal 100 mg daily.  Patient has no rash or itching.  I would also increase his Vistaril 50 mg to take 1-2 capsule as needed.  I will discontinue trazodone since it is not helping as much.  He will continue Klonopin from his primary care physician.  Encouraged to see [facility]  for counseling.  Recommended to call us if he has any question, concern if he feel worsening of the symptom.  Discuss safety plan that anytime having active suicidal thoughts or homicidal thoughts.  He need to call 911 or go to the local emergency room.  Follow-up in 2 months.   Christpoher Sievers T., MD 07/11/2016, 4:36 PM

## 2016-07-13 ENCOUNTER — Encounter (HOSPITAL_COMMUNITY): Payer: Self-pay | Admitting: Licensed Clinical Social Worker

## 2016-07-13 ENCOUNTER — Ambulatory Visit (INDEPENDENT_AMBULATORY_CARE_PROVIDER_SITE_OTHER): Payer: BLUE CROSS/BLUE SHIELD | Admitting: Licensed Clinical Social Worker

## 2016-07-13 DIAGNOSIS — F411 Generalized anxiety disorder: Secondary | ICD-10-CM | POA: Diagnosis not present

## 2016-07-13 DIAGNOSIS — F331 Major depressive disorder, recurrent, moderate: Secondary | ICD-10-CM
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
## 2016-07-26 ENCOUNTER — Encounter: Payer: Self-pay | Admitting: Medical

## 2016-07-26 ENCOUNTER — Ambulatory Visit (INDEPENDENT_AMBULATORY_CARE_PROVIDER_SITE_OTHER): Payer: BLUE CROSS/BLUE SHIELD | Admitting: Medical

## 2016-07-26 VITALS — HR 79 | Temp 98.0°F | Resp 16 | Ht 74.0 in | Wt 227.5 lb

## 2016-07-26 DIAGNOSIS — F419 Anxiety disorder, unspecified: Secondary | ICD-10-CM

## 2016-07-26 DIAGNOSIS — J0100 Acute maxillary sinusitis, unspecified: Secondary | ICD-10-CM | POA: Diagnosis not present

## 2016-07-26 DIAGNOSIS — M797 Fibromyalgia: Secondary | ICD-10-CM | POA: Diagnosis not present

## 2016-07-26 MED ORDER — PREGABALIN 75 MG PO CAPS: 75.0000 mg | ORAL_CAPSULE | Freq: Two times a day (BID) | ORAL | 0 refills | Status: DC

## 2016-07-26 MED ORDER — AZITHROMYCIN 250 MG PO TABS: ORAL_TABLET | ORAL | 0 refills | Status: DC

## 2016-07-26 MED ORDER — FLUTICASONE PROPIONATE 50 MCG/ACT NA SUSP
2.0000 | Freq: Every day | NASAL | 1 refills | Status: DC
Start: 2016-07-26 — End: 2016-09-14

## 2016-07-26 MED ORDER — AMOXICILLIN-POT CLAVULANATE 875-125 MG PO TABS: 1.0000 | ORAL_TABLET | Freq: Two times a day (BID) | ORAL | 0 refills | Status: DC

## 2016-07-26 MED ORDER — BENZONATATE 100 MG PO CAPS: 100.0000 mg | ORAL_CAPSULE | Freq: Three times a day (TID) | ORAL | 0 refills | Status: DC | PRN

## 2016-07-26 NOTE — Progress Notes (Signed)
Subjective:    Patient ID: Curtis Wood, male    DOB: 11/15/1960, 56 y.o.   MRN: [id number]  HPI   Pt in for some sinus pressure about 3-4 weeks ago. Then about 2 weeks ago got worse with pnd and more sinus pressure. Some occasional mucous/brown colored mucous when he clears his throat. ST is getting worse and now some mucous when he blows his nose.   He has some vertigo associated with sinus pressure and some rt ear pain today.  Pt is still stressed and anxious about ex wife tyring to sue him regarding child care of dependant kids.   Review of Systems  Constitutional: Negative for chills, fatigue and fever.  HENT: Positive for congestion, ear pain, rhinorrhea, sinus pain, sinus pressure and sore throat.   Respiratory: Negative for cough, choking, shortness of breath and wheezing.   Cardiovascular: Negative for chest pain and palpitations.  Gastrointestinal: Negative for abdominal pain.  Musculoskeletal: Negative for back pain, gait problem and neck pain.       Pt states years of some diffuse upper back and body pain. Diffuse pain from neck to waist. Pain all over at times. But also pain varies location as well. Pt also states swimming may have helped. Pain is not mid spinal.   Pt mentioned in past he used cymbalta and it helped his body aches but he gained a lot of wait. No mid spinal pain.  Skin: Negative for rash.  Neurological: Negative for dizziness, seizures, weakness and headaches.  Hematological: Negative for adenopathy. Does not bruise/bleed easily.  Psychiatric/Behavioral: Negative for behavioral problems, confusion, sleep disturbance and suicidal ideas. The patient is nervous/anxious.        Pt is stressed ans anxious since his wife is tyring to sue him regarding court proceeding.     Past Medical History:  Diagnosis Date  . Allergy   . Anxiety   . Depression   . GERD (gastroesophageal reflux disease)   . Hiatal hernia 1990's     Social History   Social  History  . Marital status: Married    Spouse name: N/A  . Number of children: N/A  . Years of education: N/A   Occupational History  . Not on file.   Social History Main Topics  . Smoking status: Never Smoker  . Smokeless tobacco: Never Used  . Alcohol use 0.0 oz/week     Comment: occasional wine   . Drug use: No  . Sexual activity: Not on file   Other Topics Concern  . Not on file   Social History Narrative  . No narrative on file    Past Surgical History:  Procedure Laterality Date  . Acl replacement Left   . CHOLECYSTECTOMY    . surgery for pigeon breast repair      Family History  Problem Relation Age of Onset  . Colon cancer Neg Hx     No Known Allergies  Current Outpatient Prescriptions on File Prior to Visit  Medication Sig Dispense Refill  . clonazePAM (KLONOPIN) 0.5 MG tablet TAKE 1 TABLET BY MOUTH EVERY NIGHT AT BEDTIME 30 tablet 0  . clonazePAM (KLONOPIN) 1 MG tablet Take 1 tablet (1 mg total) by mouth 2 (two) times daily as needed for anxiety. 30 tablet 2  . hydrOXYzine (VISTARIL) 50 MG capsule Take 1-2 capsule at bed time (Patient taking differently: as needed. Take 1-2 capsule at bed time) 45 capsule 1  . lamoTRIgine (LAMICTAL) 100 MG tablet Take 1 tablet (  100 mg total) by mouth daily. 30 tablet 1   No current facility-administered medications on file prior to visit.     Pulse 79   Temp 98 F (36.7 C) (Oral)   Resp 16   Ht 6\' 2"  (1.88 m)   Wt 227 lb 8 oz (103.2 kg)   SpO2 98%   BMI 29.21 kg/m       Objective:   Physical Exam  General  Mental Status - Alert. General Appearance - Well groomed. Not in acute distress.  Skin Rashes- No Rashes.  HEENT Head- Normal. Ear Auditory Canal - Left- Normal. Right - Normal.Tympanic Membrane- Left- Normal. Right- mild dull tm Eye Sclera/Conjunctiva- Left- Normal. Right- Normal. Nose & Sinuses Nasal Mucosa- Left-  Boggy and Congested. Right-  Boggy and  Congested.Bilateral maxillary and frontal  sinus pressure. Mouth & Throat Lips: Upper Lip- Normal: no dryness, cracking, pallor, cyanosis, or vesicular eruption. Lower Lip-Normal: no dryness, cracking, pallor, cyanosis or vesicular eruption. Buccal Mucosa- Bilateral- No Aphthous ulcers. Oropharynx- No Discharge or Erythema. Tonsils: Characteristics- Bilateral- No Erythema or Congestion. Size/Enlargement- Bilateral- No enlargement. Discharge- bilateral-None.  Neck Neck- Supple. No Masses.   Chest and Lung Exam Auscultation: Breath Sounds:-Clear even and unlabored.  Cardiovascular Auscultation:Rythm- Regular, rate and rhythm. Murmurs & Other Heart Sounds:Ausculatation of the heart reveal- No Murmurs.  Lymphatic Head & Neck General Head & Neck Lymphatics: Bilateral: Description- No Localized lymphadenopathy.   Neurologic Cranial Nerve exam:- CN III-XII intact(No nystagmus), symmetric smile. Strength:- 5/5 equal and symmetric strength both upper and lower extremities.      Assessment & Plan:  Your appear to have a sinus infection. I am prescribing  azithromycin antibiotic for the infection. To help with the nasal congestion I prescribed  flonase nasal steroid. For your associated cough, I prescribed cough medicine benzonatate   Rest, hydrate, tylenol for fever.  Your diffuse back aches over the years maybe fibromyalgia related. I will rx lyrica.   I think you vertigo type symptoms will eventually resolve with sinus infection.    Follow up in 7 days or as needed.  Pt pain is diffuse and muscular. Neuropathic like as well. Since responded to cymbalta in past will rx lyrica. Note pain not mid spinal.  Kiele Heavrin, Percell Miller, PA-C

## 2016-07-26 NOTE — Patient Instructions (Addendum)
Your appear to have a sinus infection. I am prescribing  Azithromycin antibiotic for the infection. To help with the nasal congestion I prescribed flonase nasal steroid. For your associated cough, I prescribed cough medicine benzonatate   Rest, hydrate, tylenol for fever.  Your diffuse back aches over the years maybe fibromyalgia related. I will rx lyrica.   I think you vertigo type symptoms will eventually resolve with sinus infection.    Follow up in 7 days or as needed.  With recent stress he and anxiety he wants to me to refill his clonazepam when he runs out. Discussed will do so but will be low dose and only 30 tabs. He will need to give uds before next rx of klonopin.

## 2016-07-26 NOTE — Progress Notes (Signed)
Pre visit review using our clinic review tool, if applicable. No additional management support is needed unless otherwise documented below in the visit note/SLS  

## 2016-07-28 ENCOUNTER — Telehealth: Payer: Self-pay | Admitting: *Deleted

## 2016-07-28 NOTE — Telephone Encounter (Signed)
Received fax request for PA for Lyrica 75mg ; Initiated via CMM, awaiting response/SLS 03/02

## 2016-08-01 MED ORDER — GABAPENTIN 100 MG PO CAPS: 100.0000 mg | ORAL_CAPSULE | Freq: Three times a day (TID) | ORAL | 0 refills | Status: AC

## 2016-08-01 NOTE — Telephone Encounter (Signed)
Insurance will not cover Lyrica; alternatives given [employer] are lamotrigine, Carbamazepine, Gabapentin/SLS 03/06 Please Advise.

## 2016-08-01 NOTE — Telephone Encounter (Signed)
Since lyrica not covered. I did prescribe neurontin. Rx low dose neurontin for one month. After a month may need to increase dose. Need to start low and increase dose gradually. Try to avoid sedation side effect. Notify pt.

## 2016-08-02 NOTE — Telephone Encounter (Signed)
Pt notified and made aware. He was very appreciative for the call.

## 2016-08-16 ENCOUNTER — Telehealth: Payer: Self-pay

## 2016-08-16 NOTE — Telephone Encounter (Signed)
PA initiated via Covermymeds; KEY: [id number]. Received real-time approval.   [id number];Review Type:Prior Auth;Coverage Start Date:08/16/2016;Coverage End Date:08/16/2017

## 2016-08-22 ENCOUNTER — Ambulatory Visit (HOSPITAL_COMMUNITY): Payer: Self-pay | Admitting: Licensed Clinical Social Worker

## 2016-08-30 ENCOUNTER — Other Ambulatory Visit: Payer: BLUE CROSS/BLUE SHIELD

## 2016-08-31 ENCOUNTER — Encounter: Payer: Self-pay | Admitting: Medical

## 2016-09-05 ENCOUNTER — Ambulatory Visit (HOSPITAL_COMMUNITY): Payer: Self-pay | Admitting: Licensed Clinical Social Worker

## 2016-09-07 ENCOUNTER — Ambulatory Visit (HOSPITAL_COMMUNITY): Payer: Self-pay | Admitting: Psychiatry

## 2016-09-14 ENCOUNTER — Other Ambulatory Visit: Payer: Self-pay | Admitting: Medical

## 2016-09-14 ENCOUNTER — Encounter (HOSPITAL_COMMUNITY): Payer: Self-pay | Admitting: Psychiatry

## 2016-09-14 ENCOUNTER — Ambulatory Visit (INDEPENDENT_AMBULATORY_CARE_PROVIDER_SITE_OTHER): Payer: BLUE CROSS/BLUE SHIELD | Admitting: Psychiatry

## 2016-09-14 DIAGNOSIS — Z79899 Other long term (current) drug therapy: Secondary | ICD-10-CM

## 2016-09-14 DIAGNOSIS — F331 Major depressive disorder, recurrent, moderate: Secondary | ICD-10-CM

## 2016-09-14 MED ORDER — HYDROXYZINE PAMOATE 50 MG PO CAPS: 50.0000 mg | ORAL_CAPSULE | Freq: Every day | ORAL | 2 refills | Status: DC

## 2016-09-14 MED ORDER — LAMOTRIGINE 150 MG PO TABS: 150.0000 mg | ORAL_TABLET | Freq: Every day | ORAL | 2 refills | Status: DC

## 2016-09-14 NOTE — Progress Notes (Signed)
[place] MD/PA/NP OP Progress Note  09/14/2016 10:41 AM Curtis Wood  MRN:  [id number]  Chief Complaint:  Chief Complaint    Follow-up     Subjective:  I went to [place] for child custody case that it was postponed.  I was frustrated.    HPI: Curtis Wood came for his follow-up appointment.  He is frustrated because recently he went to [place] for court hearing about child custody case but judge decided to postpone and he is not sure when is the next case.  Patient admitted he was very irritable, angry and frustrated .  He also did not had a chance to see his children.  His ex-wife did not allow him to see the children.  Patient is not sure when is the next court date.  He is taking Lamictal 100 mg and Vistaril 50 mg at bedtime.  He admitted medicines has helping him to sleep and he is no longer taking trazodone or Ambien.  He also saw but like to continue therapy .  He endorses family situation is not getting better because his wife now wants to move back [place] and he does not want to go there because there is no job for him.  He is not sure what he will do.  He continues to take Klonopin as needed which is prescribed by his primary care physician.  He feels medicine doesn't work but his situation does not get better and he continues to feel depressed and irritable.  Patient denies any hallucination, paranoia, suicidal thoughts or homicidal thought.  He denies any crying spells or any feeling of hopelessness.  He endorses that he stop smoking marijuana and he does not drink alcohol.  Patient lives with his wife.  His energy level is good.  His vital signs are stable.  Visit Diagnosis:    ICD-9-CM ICD-10-CM   1. Moderate episode of recurrent major depressive disorder (HCC) 296.32 F33.1 lamoTRIgine (LAMICTAL) 150 MG tablet     hydrOXYzine (VISTARIL) 50 MG capsule    Past Psychiatric History: Reviewed. Patient reported symptoms of depression for more than 15-20 years. He has been seen  psychiatrists in [place] and he had tried Celexa, Lexapro, Causing weight gain, Abilify and Effexor which did not help him and Wellbutrin which makes him more anxious. We have tried Prozac but it makes him dizzy .  He also tried trazodone and Ambien as prescribed by primary care physician but stopped due to lack of efficacy.  Patient denies any history of suicidal attempt, inpatient treatment, psychosis, mania, hallucination.  Past Medical History:  Past Medical History:  Diagnosis Date  . Allergy   . Anxiety   . Depression   . GERD (gastroesophageal reflux disease)   . Hiatal hernia 1990's    Past Surgical History:  Procedure Laterality Date  . Acl replacement Left   . CHOLECYSTECTOMY    . surgery for pigeon breast repair      Family Psychiatric History: Reviewed.  Family History:  Family History  Problem Relation Age of Onset  . Colon cancer Neg Hx     Social History:  Social History   Social History  . Marital status: Married    Spouse name: N/A  . Number of children: N/A  . Years of education: N/A   Social History Main Topics  . Smoking status: Never Smoker  . Smokeless tobacco: Never Used  . Alcohol use No     Comment: occasional wine   . Drug use: No  .  Sexual activity: Yes    Partners: Female    Birth control/ protection: None   Other Topics Concern  . None   Social History Narrative  . None    Allergies: No Known Allergies  Metabolic Disorder Labs: No results found for: HGBA1C, MPG No results found for: PROLACTIN Lab Results  Component Value Date   CHOL 171 06/28/2015   TRIG 41.0 06/28/2015   HDL 55.70 06/28/2015   CHOLHDL 3 06/28/2015   VLDL 8.2 06/28/2015   LDLCALC 107 (H) 06/28/2015     Current Medications: Current Outpatient Prescriptions  Medication Sig Dispense Refill  . azithromycin (ZITHROMAX) 250 MG tablet Take 2 tablets by mouth on day 1, followed by 1 tablet by mouth daily for 4 days. 6 tablet 0  . benzonatate (TESSALON)  100 MG capsule Take 1 capsule (100 mg total) by mouth 3 (three) times daily as needed for cough. 21 capsule 0  . clonazePAM (KLONOPIN) 0.5 MG tablet TAKE 1 TABLET BY MOUTH EVERY NIGHT AT BEDTIME 30 tablet 0  . fluticasone (FLONASE) 50 MCG/ACT nasal spray Place 2 sprays into both nostrils daily as needed for allergies or rhinitis.    . fluticasone (FLONASE) 50 MCG/ACT nasal spray Place 2 sprays into both nostrils daily. 16 g 1  . gabapentin (NEURONTIN) 100 MG capsule Take 1 capsule (100 mg total) by mouth 3 (three) times daily. 90 capsule 0  . hydrOXYzine (VISTARIL) 50 MG capsule Take 1-2 capsule at bed time (Patient taking differently: as needed. Take 1-2 capsule at bed time) 45 capsule 1  . lamoTRIgine (LAMICTAL) 100 MG tablet Take 1 tablet (100 mg total) by mouth daily. 30 tablet 1   No current facility-administered medications for this visit.     Neurologic: Headache: No Seizure: No Paresthesias: No  Musculoskeletal: Strength & Muscle Tone: within normal limits Gait & Station: normal Patient leans: N/A  Psychiatric Specialty Exam: ROS  Blood pressure 120/82, pulse 75, height 6\' 2"  (1.88 m), weight 227 lb (103 kg), SpO2 98 %.Body mass index is 29.15 kg/m.  General Appearance: Well Groomed  Eye Contact:  Good  Speech:  Clear and Coherent  Volume:  Normal  Mood:  Dysphoric  Affect:  Congruent  Thought Process:  Goal Directed  Orientation:  Full (Time, Place, and Person)  Thought Content: Logical and Rumination   Suicidal Thoughts:  No  Homicidal Thoughts:  No  Memory:  Immediate;   Good Recent;   Good Remote;   Good  Judgement:  Good  Insight:  Good  Psychomotor Activity:  Normal  Concentration:  Concentration: Good and Attention Span: Good  Recall:  Good  Fund of Knowledge: Good  Language: Good  Akathisia:  No  Handed:  Right  AIMS (if indicated):  0  Assets:  Communication Skills Desire for Improvement Housing Physical Health  ADL's:  Intact  Cognition: WNL   Sleep:  fair   Assessment: Major depressive disorder, recurrent.  Plan: Patient is taking Lamictal 100 mg and Vistaril 50 mg at bedtime.  Patient like his current psychiatric medication but he continues to have family situation and stress .  He still have residual irritability and frustration.  I recommended to try Lamictal 150 mg daily.  He has no rash, itching, tremors, shakes or any EPS.  Continue Vistaril 50 mg at bedtime.  His sleep is better with the Vistaril.  Encouraged to see happen for counseling.  Recommended to call us back if he has any question or any concern.  Follow-up in 3 months.     ARFEEN,SYED T., MD 09/14/2016, 10:41 AM

## 2016-09-20 MED ORDER — CLONAZEPAM 0.5 MG PO TABS: 0.5000 mg | ORAL_TABLET | Freq: Every day | ORAL | 0 refills | Status: DC

## 2016-09-20 NOTE — Telephone Encounter (Signed)
I signed the rx klonopin. Placed on your desk. He can pick up rx. Ask him to follow up in July or August.

## 2016-09-20 NOTE — Telephone Encounter (Signed)
Refill Request: Clonazepam   Last RX:07/06/16 Last OV:07/26/16 Next OV: None  UDS:08/30/16 CSC:03/13/16

## 2016-09-21 NOTE — Telephone Encounter (Signed)
Notified pt rx ready for pick up at front desk. Scheduled follow up for 11/27/16.

## 2016-10-05 ENCOUNTER — Ambulatory Visit (INDEPENDENT_AMBULATORY_CARE_PROVIDER_SITE_OTHER): Payer: BLUE CROSS/BLUE SHIELD | Admitting: Licensed Clinical Social Worker

## 2016-10-05 DIAGNOSIS — F322 Major depressive disorder, single episode, severe without psychotic features: Secondary | ICD-10-CM

## 2016-10-06 ENCOUNTER — Encounter: Payer: Self-pay | Admitting: Medical

## 2016-10-06 ENCOUNTER — Telehealth: Payer: Self-pay | Admitting: Medical

## 2016-10-06 ENCOUNTER — Ambulatory Visit (INDEPENDENT_AMBULATORY_CARE_PROVIDER_SITE_OTHER): Payer: BLUE CROSS/BLUE SHIELD | Admitting: Medical

## 2016-10-06 ENCOUNTER — Ambulatory Visit (HOSPITAL_BASED_OUTPATIENT_CLINIC_OR_DEPARTMENT_OTHER)
Admission: RE | Admit: 2016-10-06 | Discharge: 2016-10-06 | Disposition: A | Discharge status: Home / Self Care | Payer: BLUE CROSS/BLUE SHIELD | Source: Ambulatory Visit | Attending: Medical | Admitting: Medical

## 2016-10-06 VITALS — BP 132/85 | HR 78 | Temp 98.1°F | Resp 16 | Ht 76.0 in | Wt 227.2 lb

## 2016-10-06 DIAGNOSIS — F329 Major depressive disorder, single episode, unspecified: Secondary | ICD-10-CM

## 2016-10-06 DIAGNOSIS — F419 Anxiety disorder, unspecified: Secondary | ICD-10-CM

## 2016-10-06 DIAGNOSIS — T148XXA Other injury of unspecified body region, initial encounter: Secondary | ICD-10-CM | POA: Diagnosis not present

## 2016-10-06 DIAGNOSIS — M79604 Pain in right leg: Secondary | ICD-10-CM | POA: Insufficient documentation

## 2016-10-06 DIAGNOSIS — F32A Depression, unspecified: Secondary | ICD-10-CM

## 2016-10-06 LAB — CBC WITH DIFFERENTIAL/PLATELET
BASOS PCT: 0.6 % (ref 0.0–3.0)
Basophils Absolute: 0 10*3/uL (ref 0.0–0.1)
EOS PCT: 2.3 % (ref 0.0–5.0)
Eosinophils Absolute: 0.2 10*3/uL (ref 0.0–0.7)
HCT: 45.2 % (ref 39.0–52.0)
Hemoglobin: 15.3 g/dL (ref 13.0–17.0)
Lymphocytes Relative: 19.8 % (ref 12.0–46.0)
Lymphs Abs: 1.6 10*3/uL (ref 0.7–4.0)
MCHC: 33.8 g/dL (ref 30.0–36.0)
MCV: 89 fl (ref 78.0–100.0)
MONO ABS: 0.7 10*3/uL (ref 0.1–1.0)
Monocytes Relative: 8.5 % (ref 3.0–12.0)
NEUTROS ABS: 5.4 10*3/uL (ref 1.4–7.7)
NEUTROS PCT: 68.8 % (ref 43.0–77.0)
Platelets: 203 10*3/uL (ref 150.0–400.0)
RBC: 5.08 Mil/uL (ref 4.22–5.81)
RDW: 13.7 % (ref 11.5–15.5)
WBC: 7.9 10*3/uL (ref 4.0–10.5)

## 2016-10-06 MED ORDER — CLONAZEPAM 0.5 MG PO TABS: 0.5000 mg | ORAL_TABLET | Freq: Two times a day (BID) | ORAL | 0 refills | Status: DC | PRN

## 2016-10-06 NOTE — Patient Instructions (Addendum)
For your history of anxiety and upcoming expected stressful events will rx clonazepam. Will post date fill date on 10-20-2016.  Continue to see psychiatrist and you counselor.  For faint in extremity can use tylenol or ibuprofen. But will also get cbc to assess hx of bruise to thigh(check platelets) and get rt lower ext [place] to evaluate if dvt based on pain as well as your concern.  Follow up in 2-3 weeks or as needed

## 2016-10-06 NOTE — Telephone Encounter (Signed)
Opened by accident

## 2016-10-06 NOTE — Progress Notes (Signed)
Subjective:    Patient ID: Curtis Wood, male    DOB: 08-19-1960, 56 y.o.   MRN: [id number]  HPI   Pt in for follow up.  Pt states since mid winter he has gotten faint occasional cold sensation to his rt lateral calf. He states in winter it happened more. Less recently. But some medial thigh faint sensation that he describes as "weird/odd) like something is there. On palpation to medial thigh faint No rash th leg. No pain to lower back. No rash to area. No stabbing pain. Also rt lateral calf faint tender.  Pt googled/research and has concern for dvt. Pt dad had blood clot in his leg and dad may have had  PE.    Pt states tryng to do exercise. But at time feet feel heavy. He used to walk 6-10 miles at times.  Pt in past had been on klonopin. Pt does not want to be on antidepressant. Pt states his psychiatrist gave him hydroxyzine but it made him feel very tired the next day. He definitley is convince klonopin helps him.   Pt states he sometimes tries to stop klonopin and use sparingly as I innstucted him. He states at time he gave uds he had been off med for 2-3 days.   Pt states he had upcoming court proceeding with xray wife and may be forced to travel to [employer]. He went their already and expected to attend proceeding and then those proceedings were canceled. Wife is also about to move to [facility] to spend time with grandchildren.  With has not used lamictal that his psychiatrist had written him. Pt still seeing counselor. Pt states counselor wants him to do psycho therapy.    Review of Systems  Constitutional: Positive for fatigue. Negative for chills and fever.  Respiratory: Negative for cough, chest tightness, shortness of breath and wheezing.   Cardiovascular: Negative for chest pain and palpitations.  Gastrointestinal: Negative for abdominal pain.  Musculoskeletal: Negative for back pain, gait problem, neck pain and neck stiffness.       Rt leg pain.  Skin: Negative  for rash.  Neurological: Negative for dizziness, seizures, numbness and headaches.  Hematological: Negative for adenopathy. Does not bruise/bleed easily.  Psychiatric/Behavioral: Positive for dysphoric mood. Negative for behavioral problems, confusion, sleep disturbance and suicidal ideas. The patient is nervous/anxious.     Past Medical History:  Diagnosis Date  . Allergy   . Anxiety   . Depression   . GERD (gastroesophageal reflux disease)   . Hiatal hernia 1990's     Social History   Social History  . Marital status: Married    Spouse name: N/A  . Number of children: N/A  . Years of education: N/A   Occupational History  . Not on file.   Social History Main Topics  . Smoking status: Never Smoker  . Smokeless tobacco: Never Used  . Alcohol use No     Comment: occasional wine   . Drug use: No  . Sexual activity: Yes    Partners: Female    Birth control/ protection: None   Other Topics Concern  . Not on file   Social History Narrative  . No narrative on file    Past Surgical History:  Procedure Laterality Date  . Acl replacement Left   . CHOLECYSTECTOMY    . surgery for pigeon breast repair      Family History  Problem Relation Age of Onset  . Colon cancer Neg Hx  No Known Allergies  Current Outpatient Prescriptions on File Prior to Visit  Medication Sig Dispense Refill  . clonazePAM (KLONOPIN) 0.5 MG tablet Take 1 tablet (0.5 mg total) by mouth at bedtime. 30 tablet 0  . gabapentin (NEURONTIN) 100 MG capsule Take 1 capsule (100 mg total) by mouth 3 (three) times daily. 90 capsule 0  . hydrOXYzine (VISTARIL) 50 MG capsule Take 1 capsule (50 mg total) by mouth at bedtime. 30 capsule 2  . lamoTRIgine (LAMICTAL) 150 MG tablet Take 1 tablet (150 mg total) by mouth daily. 30 tablet 2   No current facility-administered medications on file prior to visit.     BP 132/85 (BP Location: Left Arm, Patient Position: Sitting, Cuff Size: Normal)   Pulse 78    Temp 98.1 F (36.7 C)   Resp 16   Ht 6\' 4"  (1.93 m)   Wt 227 lb 3.2 oz (103.1 kg)   SpO2 98%   BMI 27.66 kg/m       Objective:   Physical Exam  General Mental Status- Alert. General Appearance- Not in acute distress.   Skin General: Color- Normal Color. Moisture- Normal Moisture.  Neck Carotid Arteries- Normal color. Moisture- Normal Moisture. No carotid bruits. No JVD.  Chest and Lung Exam Auscultation: Breath Sounds:-Normal.  Cardiovascular Auscultation:Rythm- Regular. Murmurs & Other Heart Sounds:Auscultation of the heart reveals- No Murmurs.  Abdomen Inspection:-Inspeection Normal. Palpation/Percussion:Note:No mass. Palpation and Percussion of the abdomen reveal- Non Tender, Non Distended + BS, no rebound or guarding.   Neurologic Cranial Nerve exam:- CN III-XII intact(No nystagmus), symmetric smile. Strength:- 5/5 equal and symmetric strength both upper and lower extremities.   Rt lower leg- faint medial aspect tenderness of thigh and faint lateral aspect tenderness rt calf. Small residual bruise rt lateral calf. Size about 10 mm.  Skin- pt denies rash but did not show me his upper thigh.     Assessment & Plan:  For your hx of anxiety and upcoming expected stressful events will rx clonazepam. Will post date fill date on 10-20-2016.  Continue to see psychiatrist and you counselor.  For faint in extremity can use tylenol or ibuprofen. But will also get cbc to assess hx of bruise to thigh(check platelets) and get rt lower ext [place] to evaluate if dvt based on pain as well as your concern.  Follow up in 2-3 weeks or as needed  Note I do think his fatigue is from depression. Labs I past have been good. cmp and tsh.  Note in past have limited klonopin use/# of tabs but in light of his wife moving to [place] and his ex-wife suing him I am giving more tabs for next month.

## 2016-10-12 ENCOUNTER — Encounter (HOSPITAL_COMMUNITY): Payer: Self-pay | Admitting: Licensed Clinical Social Worker

## 2016-10-12 DIAGNOSIS — F322 Major depressive disorder, single episode, severe without psychotic features: Secondary | ICD-10-CM | POA: Insufficient documentation

## 2016-10-12 NOTE — Progress Notes (Signed)
   THERAPIST PROGRESS NOTE  Session Time: 4:10-5:00pm  Participation Level: Active  Behavioral Response: NeatAlert/depressed  Type of Therapy: Individual Therapy  Treatment Goals addressed: Coping  Interventions: CBT, Strength-based and Supportive  Summary: Curtis Wood is a 56 y.o. male who presents with  Depression. Pt discussed his psychiatric symptoms and current life events. Pt reports his depression is worse. He does not like his job, he and his wife are not getting along, he is looking to downsize to a 1 bedroom apt due to finances. His children are not having any contact with pt and this is sad and frustrataing for pt. He went to [place] for a child support hearing and the judge continued it. Pt is very frustrated with the legal system in [place]. Discussed basic CBT concepts and the thought emotion connection. Taught pt how to challenge his belief system with alternative thoughts. Pt was able to formulate healthier thoughts. Taught pt new grounding technique. Pt was receptive to the mindfulness practice and concepts.   Suicidal/Homicidal: Nowithout intent/plan  Therapist Response: Assessed pts current functioning And reviewed progress. Assisted pt processing issues with  issues in marriage, family, responsibilities, depressive symptoms and processing for management of stressors.  Plan: Return again in 2 weeks using CBT-based therapy.  Diagnosis: Axis I: F32.2, F41.1        Jenkins Rouge, LCAS 10/05/16

## 2016-10-13 ENCOUNTER — Encounter: Payer: Self-pay | Admitting: Medical

## 2016-10-16 MED ORDER — DICLOFENAC SODIUM 75 MG PO TBEC: 75.0000 mg | DELAYED_RELEASE_TABLET | Freq: Two times a day (BID) | ORAL | 0 refills | Status: DC

## 2016-10-16 NOTE — Telephone Encounter (Signed)
Rx diclofenac sent to pharmacy. Pt states he will limit use to Friday, Saturday and Sunday.

## 2016-11-06 ENCOUNTER — Encounter (HOSPITAL_COMMUNITY): Payer: Self-pay | Admitting: Licensed Clinical Social Worker

## 2016-11-06 ENCOUNTER — Ambulatory Visit (INDEPENDENT_AMBULATORY_CARE_PROVIDER_SITE_OTHER): Payer: BLUE CROSS/BLUE SHIELD | Admitting: Licensed Clinical Social Worker

## 2016-11-06 DIAGNOSIS — F322 Major depressive disorder, single episode, severe without psychotic features: Secondary | ICD-10-CM

## 2016-11-06 DIAGNOSIS — F411 Generalized anxiety disorder: Secondary | ICD-10-CM

## 2016-11-06 NOTE — Progress Notes (Signed)
   THERAPIST PROGRESS NOTE  Session Time: 4:10-5:00pm  Participation Level: Active  Behavioral Response: NeatAlert/depressed  Type of Therapy: Individual Therapy  Treatment Goals addressed: Coping  Interventions: CBT, Strength-based and Supportive  Summary: Curtis Wood is a 56 y.o. male who presents with  Depression. Pt discussed his psychiatric symptoms and current life events.  Pts wife is moving back to [place] at the end of the month.Processed pt's feelings surrounding this decision. Worked with pt using CBT skills with thought emotion and alternative perspective. Pt was open to this. Pt is sad about the continued lack of communication from his children. Pt is willing to discuss the next phase of his life where he identified new interests, hobbies and friendships. Pt shared he was less sad and more hopeful after the session.  Suicidal/Homicidal: Nowithout intent/plan  Therapist Response: Assessed pts current functioning And reviewed progress. Assisted pt processing issues with  issues in marriage, family, responsibilities, depressive symptoms and processing for management of stressors.  Plan: Return again in 2 weeks using CBT-based therapy.  Diagnosis: Axis I: F32.2, F41.1        Jenkins Rouge, LCAS 11/06/16

## 2016-11-27 ENCOUNTER — Encounter: Payer: Self-pay | Admitting: Medical

## 2016-11-27 ENCOUNTER — Ambulatory Visit (INDEPENDENT_AMBULATORY_CARE_PROVIDER_SITE_OTHER): Payer: BLUE CROSS/BLUE SHIELD | Admitting: Medical

## 2016-11-27 VITALS — BP 119/68 | HR 74 | Temp 97.9°F | Resp 16 | Ht 73.0 in | Wt 227.2 lb

## 2016-11-27 DIAGNOSIS — G4700 Insomnia, unspecified: Secondary | ICD-10-CM

## 2016-11-27 DIAGNOSIS — F329 Major depressive disorder, single episode, unspecified: Secondary | ICD-10-CM

## 2016-11-27 DIAGNOSIS — F32A Depression, unspecified: Secondary | ICD-10-CM

## 2016-11-27 DIAGNOSIS — F419 Anxiety disorder, unspecified: Secondary | ICD-10-CM

## 2016-11-27 MED ORDER — DICLOFENAC SODIUM 75 MG PO TBEC: 75.0000 mg | DELAYED_RELEASE_TABLET | Freq: Two times a day (BID) | ORAL | 0 refills | Status: DC

## 2016-11-27 MED ORDER — ZOLPIDEM TARTRATE 10 MG PO TABS: 10.0000 mg | ORAL_TABLET | Freq: Every day | ORAL | 1 refills | Status: DC

## 2016-11-27 NOTE — Patient Instructions (Addendum)
For insomnia will rx ambien 10 mg to use at night.  For anxiety want you to use just one tab a day if possible. Twice daily use if need but use just one if you can. You should have plenty of tabs left presently so want you to call 3 days before you run out.   Continue to exercise as you have been.  For your  Neck/trapezius pain making diclofenac available.  Follow up in 2 months or as needed

## 2016-11-27 NOTE — Progress Notes (Signed)
Subjective:    Patient ID: Curtis Wood, male    DOB: March 22, 1961, 56 y.o.   MRN: [id number]  HPI  Tried to access counselor notes as I see pt and give meds for anxiety. Tried to review as thought would be helpful. But no access given per epic.  Pt states his mood is better over the summer. He states he is going on long walks again.  Pt states he is out of [place]. He takes it at night of for insomnia. Pt had old prescription of ambien and he has not had any side effects.   He also uses clonazepam. He recently is taking clonazepam about one time a day presently.    Pt has seen psychiatrist and prescribed lamictal, vistaril and trazadone but he does not want to use those. Pt does not have any homicidal or suicidal effects.  Pt wife just moved to [facility]. His wife moved to be closer to grandchildren.  Pt updates me that he limits use of diclofenac for day if neck and shoulder pain are worse. He is doing well with chiropracter visits.  I tried to find pt UDS done in past. I think it is in scanning. I do remember reviewing. But sometimes items take weeks to month to get scanned?    Review of Systems  Constitutional: Negative for chills, fatigue and fever.  HENT: Negative for congestion, ear pain, postnasal drip, sinus pain and sinus pressure.   Respiratory: Negative for cough, chest tightness, shortness of breath and wheezing.   Cardiovascular: Negative for chest pain and palpitations.  Gastrointestinal: Negative for abdominal pain.  Musculoskeletal: Negative for back pain.       Trapezius pain.  Skin: Negative for rash.  Neurological: Negative for dizziness, speech difficulty, weakness, numbness and headaches.  Hematological: Negative for adenopathy. Does not bruise/bleed easily.  Psychiatric/Behavioral: Positive for sleep disturbance. Negative for agitation, decreased concentration, dysphoric mood and suicidal ideas. The patient is nervous/anxious.     Past Medical History:    Diagnosis Date  . Allergy   . Anxiety   . Depression   . GERD (gastroesophageal reflux disease)   . Hiatal hernia 1990's     Social History   Social History  . Marital status: Married    Spouse name: N/A  . Number of children: N/A  . Years of education: N/A   Occupational History  . Not on file.   Social History Main Topics  . Smoking status: Never Smoker  . Smokeless tobacco: Never Used  . Alcohol use No     Comment: occasional wine   . Drug use: No  . Sexual activity: Yes    Partners: Female    Birth control/ protection: None   Other Topics Concern  . Not on file   Social History Narrative  . No narrative on file    Past Surgical History:  Procedure Laterality Date  . Acl replacement Left   . CHOLECYSTECTOMY    . surgery for pigeon breast repair      Family History  Problem Relation Age of Onset  . Colon cancer Neg Hx     No Known Allergies  Current Outpatient Prescriptions on File Prior to Visit  Medication Sig Dispense Refill  . clonazePAM (KLONOPIN) 0.5 MG tablet Take 1 tablet (0.5 mg total) by mouth at bedtime. 30 tablet 0  . clonazePAM (KLONOPIN) 0.5 MG tablet Take 1 tablet (0.5 mg total) by mouth 2 (two) times daily as needed for anxiety. Can fill on  10-20-2016. Please not to refill sooner 60 tablet 0  . diclofenac (VOLTAREN) 75 MG EC tablet Take 1 tablet (75 mg total) by mouth 2 (two) times daily. 30 tablet 0  . gabapentin (NEURONTIN) 100 MG capsule Take 1 capsule (100 mg total) by mouth 3 (three) times daily. 90 capsule 0  . hydrOXYzine (VISTARIL) 50 MG capsule Take 1 capsule (50 mg total) by mouth at bedtime. 30 capsule 2  . lamoTRIgine (LAMICTAL) 150 MG tablet Take 1 tablet (150 mg total) by mouth daily. 30 tablet 2   No current facility-administered medications on file prior to visit.     BP 119/68 (BP Location: Left Arm, Patient Position: Sitting, Cuff Size: Normal)   Pulse 74   Temp 97.9 F (36.6 C) (Oral)   Resp 16   Ht 6\' 1"  (1.854  m)   Wt 227 lb 3.2 oz (103.1 kg)   SpO2 100%   BMI 29.98 kg/m       Objective:   Physical Exam  General Mental Status- Alert. General Appearance- Not in acute distress.   Neck- no mid cspine pain. Left trapezius tender to palpation.  Skin General: Color- Normal Color. Moisture- Normal Moisture.  Neck Carotid Arteries- Normal color. Moisture- Normal Moisture. No carotid bruits. No JVD.  Chest and Lung Exam Auscultation: Breath Sounds:-Normal.  Cardiovascular Auscultation:Rythm- Regular. Murmurs & Other Heart Sounds:Auscultation of the heart reveals- No Murmurs.  Abdomen Inspection:-Inspeection Normal. Palpation/Percussion:Note:No mass. Palpation and Percussion of the abdomen reveal- Non Tender, Non Distended + BS, no rebound or guarding.    Neurologic Cranial Nerve exam:- CN III-XII intact(No nystagmus), symmetric smile. Strength:- 5/5 equal and symmetric strength both upper and lower extremities.      Assessment & Plan:  For insomnia will rx ambien 10 mg to use at night.  For anxiety want you to use just one tab a day if possible. Twice daily use if need but use just one if you can. You should have plenty of tabs left presently so want you to call 3 days before you run out.   Continue to exercise as you have been.  For your neck/trapezius  pain making diclofenac available.  Follow up in 2 months or as needed  Curtis Wood, Percell Miller, [employer]

## 2016-12-07 ENCOUNTER — Ambulatory Visit (INDEPENDENT_AMBULATORY_CARE_PROVIDER_SITE_OTHER): Payer: BLUE CROSS/BLUE SHIELD | Admitting: Licensed Clinical Social Worker

## 2016-12-07 DIAGNOSIS — F331 Major depressive disorder, recurrent, moderate: Secondary | ICD-10-CM | POA: Diagnosis not present

## 2016-12-07 DIAGNOSIS — F411 Generalized anxiety disorder: Secondary | ICD-10-CM

## 2016-12-07 NOTE — Progress Notes (Signed)
   THERAPIST PROGRESS NOTE  Session Time: 4:10-5:00pm  Participation Level: Active  Behavioral Response: NeatAlert/depressed  Type of Therapy: Individual Therapy  Treatment Goals addressed: Coping  Interventions: CBT, Strength-based and Supportive  Summary: Curtis Wood is a 56 y.o. male who presents with  Depression. Pt discussed his psychiatric symptoms and current life events.  Pts wife moved back to [place] at the end of last month.  Pt has been keeping himself busy because he knows how easy it will be to fall back into his hole of depression. Pt has found activities in the surrounding communities which peak his interest of discovery and curiosity. Processed with pt his "new normal." Suggested to pt to regulate his experiences to not have burn out. Validated pt on his choices to find his new normal and try to stay emotionally stable during this transition. Pt reported "Im finding myself again." Processed with pt his losses in the last few years and it's effects: relationships, family, $, career, homeland. Taught pt grounding technique (bumble bee breath) to add to his coping skills in working through his struggles.  Suicidal/Homicidal: Nowithout intent/plan  Therapist Response: Assessed pts current functioning And reviewed progress. Assisted pt processing issues with  issues in marriage, family, new normal, depressive symptoms and processing for management of stressors.  Plan: Return again in 4 weeks using CBT-based therapy.  Diagnosis: Axis I: F32.2, F41.1        Jenkins Rouge, LCAS 12/07/16

## 2016-12-12 ENCOUNTER — Encounter (HOSPITAL_COMMUNITY): Payer: Self-pay | Admitting: Licensed Clinical Social Worker

## 2016-12-21 ENCOUNTER — Ambulatory Visit (HOSPITAL_COMMUNITY): Payer: Self-pay | Admitting: Psychiatry

## 2017-01-02 ENCOUNTER — Other Ambulatory Visit: Payer: Self-pay | Admitting: Medical

## 2017-01-06 ENCOUNTER — Other Ambulatory Visit: Payer: Self-pay | Admitting: Medical

## 2017-01-09 ENCOUNTER — Other Ambulatory Visit: Payer: Self-pay

## 2017-01-09 MED ORDER — CLONAZEPAM 0.5 MG PO TABS: 0.5000 mg | ORAL_TABLET | Freq: Two times a day (BID) | ORAL | 0 refills | Status: DC | PRN

## 2017-01-09 NOTE — Progress Notes (Unsigned)
Refill Request: Clonazepam   Last RX: 10/06/16 Last OV:11/27/16 Next YC:XKGY scheduled UDS:08/30/16 CSC:03/13/16   I signed the rx and placed I on your desk.

## 2017-01-10 NOTE — Progress Notes (Signed)
Notified pt. 

## 2017-01-18 ENCOUNTER — Other Ambulatory Visit: Payer: Self-pay

## 2017-01-18 MED ORDER — DICLOFENAC SODIUM 75 MG PO TBEC: 75.0000 mg | DELAYED_RELEASE_TABLET | Freq: Two times a day (BID) | ORAL | 0 refills | Status: DC

## 2017-01-22 ENCOUNTER — Encounter: Payer: Self-pay | Admitting: Medical

## 2017-01-22 ENCOUNTER — Ambulatory Visit (INDEPENDENT_AMBULATORY_CARE_PROVIDER_SITE_OTHER): Payer: BLUE CROSS/BLUE SHIELD | Admitting: Medical

## 2017-01-22 VITALS — BP 113/75 | HR 74 | Temp 98.3°F | Resp 16 | Ht 73.0 in | Wt 236.6 lb

## 2017-01-22 DIAGNOSIS — K5792 Diverticulitis of intestine, part unspecified, without perforation or abscess without bleeding: Secondary | ICD-10-CM

## 2017-01-22 LAB — CBC WITH DIFFERENTIAL/PLATELET
BASOS ABS: 0 10*3/uL (ref 0.0–0.1)
Basophils Relative: 0.7 % (ref 0.0–3.0)
EOS PCT: 2.5 % (ref 0.0–5.0)
Eosinophils Absolute: 0.2 10*3/uL (ref 0.0–0.7)
HEMATOCRIT: 44.8 % (ref 39.0–52.0)
Hemoglobin: 15 g/dL (ref 13.0–17.0)
LYMPHS PCT: 19.9 % (ref 12.0–46.0)
Lymphs Abs: 1.3 10*3/uL (ref 0.7–4.0)
MCHC: 33.4 g/dL (ref 30.0–36.0)
MCV: 90.2 fl (ref 78.0–100.0)
MONOS PCT: 9.1 % (ref 3.0–12.0)
Monocytes Absolute: 0.6 10*3/uL (ref 0.1–1.0)
NEUTROS ABS: 4.4 10*3/uL (ref 1.4–7.7)
Neutrophils Relative %: 67.8 % (ref 43.0–77.0)
PLATELETS: 196 10*3/uL (ref 150.0–400.0)
RBC: 4.97 Mil/uL (ref 4.22–5.81)
RDW: 13.6 % (ref 11.5–15.5)
WBC: 6.5 10*3/uL (ref 4.0–10.5)

## 2017-01-22 MED ORDER — METRONIDAZOLE 500 MG PO TABS: 500.0000 mg | ORAL_TABLET | Freq: Three times a day (TID) | ORAL | 0 refills | Status: AC

## 2017-01-22 MED ORDER — CIPROFLOXACIN HCL 500 MG PO TABS: 500.0000 mg | ORAL_TABLET | Freq: Two times a day (BID) | ORAL | 0 refills | Status: AC

## 2017-01-22 MED ORDER — METRONIDAZOLE 500 MG PO TABS: 500.0000 mg | ORAL_TABLET | Freq: Three times a day (TID) | ORAL | 0 refills | Status: DC

## 2017-01-22 NOTE — Patient Instructions (Addendum)
You do likely have diverticulitis based on location of your pain and diverticulosis on prior colonscopy.  Will rx cipro and flagyl and will get cbc. If pain perisists or worsens then get CT abdomen/pelvis.  Please notify us if not improving completely.  Follow up 10 days or as needed   Diverticulitis Diverticulitis is when small pockets in your large intestine (colon) get infected or swollen. This causes stomach pain and watery poop (diarrhea). These pouches are called diverticula. They form in people who have a condition called diverticulosis. Follow these instructions at home: Medicines  Take over-the-counter and prescription medicines only as told by your doctor. These include: ? Antibiotics. ? Pain medicines. ? Fiber pills. ? Probiotics. ? Stool softeners.  Do not drive or use heavy machinery while taking prescription pain medicine.  If you were prescribed an antibiotic, take it as told. Do not stop taking it even if you feel better. General instructions  Follow a diet as told by your doctor.  When you feel better, your doctor may tell you to change your diet. You may need to eat a lot of fiber. Fiber makes it easier to poop (have bowel movements). Healthy foods with fiber include: ? Berries. ? Beans. ? Lentils. ? Green vegetables.  Exercise 3 or more times a week. Aim for 30 minutes each time. Exercise enough to sweat and make your heart beat faster.  Keep all follow-up visits as told. This is important. You may need to have an exam of the large intestine. This is called a colonoscopy. Contact a doctor if:  Your pain does not get better.  You have a hard time eating or drinking.  You are not pooping like normal. Get help right away if:  Your pain gets worse.  Your problems do not get better.  Your problems get worse very fast.  You have a fever.  You throw up (vomit) more than one time.  You have poop that  is: ? Bloody. ? Black. ? Tarry. Summary  Diverticulitis is when small pockets in your large intestine (colon) get infected or swollen.  Take medicines only as told by your doctor.  Follow a diet as told by your doctor. This information is not intended to replace advice given to you by your health care provider. Make sure you discuss any questions you have with your health care provider. Document Released: 11/01/2007 Document Revised: 06/01/2016 Document Reviewed: 06/01/2016 Elsevier Interactive Patient Education  2017 [employer].

## 2017-01-22 NOTE — Progress Notes (Signed)
Subjective:    Patient ID: Adain Geurin, male    DOB: November 24, 1960, 56 y.o.   MRN: [id number]  HPI  Pt in for some left lower quadrant pain with some loose just recently. Symptoms started on Wednesday. Pt states same location as before and he states feels like his prior episodes of diverticulitis. He states that the pain started in the left lower quadrant with loose stools. He denies any fevers chills or sweats. No black or bloody stools reported. He states in the past antibiotics resolve symptoms quickly.  He states his mood is better recently. He states doing a lot of traveling. He states when active and doing things his mood is better. He is about to return briefly to [place] to handle some child custody issues.   Review of Systems  Constitutional: Negative for chills, diaphoresis, fatigue and fever.  Respiratory: Negative for cough, chest tightness, shortness of breath and wheezing.   Cardiovascular: Negative for chest pain and palpitations.  Gastrointestinal: Positive for abdominal pain and diarrhea. Negative for abdominal distention, anal bleeding, blood in stool, constipation, rectal pain and vomiting.  Musculoskeletal: Negative for back pain, gait problem, joint swelling, myalgias and neck stiffness.  Skin: Negative for pallor and rash.  Neurological: Negative for dizziness, light-headedness, numbness and headaches.  Hematological: Negative for adenopathy. Does not bruise/bleed easily.  Psychiatric/Behavioral: Negative for agitation, behavioral problems, confusion, self-injury and suicidal ideas. The patient is not nervous/anxious.        Mood is controlled recently.     Past Medical History:  Diagnosis Date  . Allergy   . Anxiety   . Depression   . GERD (gastroesophageal reflux disease)   . Hiatal hernia 1990's     Social History   Social History  . Marital status: Married    Spouse name: N/A  . Number of children: N/A  . Years of education: N/A    Occupational History  . Not on file.   Social History Main Topics  . Smoking status: Never Smoker  . Smokeless tobacco: Never Used  . Alcohol use No     Comment: occasional wine   . Drug use: No  . Sexual activity: Yes    Partners: Female    Birth control/ protection: None   Other Topics Concern  . Not on file   Social History Narrative  . No narrative on file    Past Surgical History:  Procedure Laterality Date  . Acl replacement Left   . CHOLECYSTECTOMY    . surgery for pigeon breast repair      Family History  Problem Relation Age of Onset  . Colon cancer Neg Hx     No Known Allergies  Current Outpatient Prescriptions on File Prior to Visit  Medication Sig Dispense Refill  . clonazePAM (KLONOPIN) 0.5 MG tablet Take 1 tablet (0.5 mg total) by mouth at bedtime. 30 tablet 0  . clonazePAM (KLONOPIN) 0.5 MG tablet Take 1 tablet (0.5 mg total) by mouth 2 (two) times daily as needed for anxiety. Can fill on 10-20-2016. Please not to refill sooner 60 tablet 0  . diclofenac (VOLTAREN) 75 MG EC tablet Take 1 tablet (75 mg total) by mouth 2 (two) times daily. 30 tablet 0  . gabapentin (NEURONTIN) 100 MG capsule Take 1 capsule (100 mg total) by mouth 3 (three) times daily. 90 capsule 0  . zolpidem (AMBIEN) 10 MG tablet Take 1 tablet (10 mg total) by mouth at bedtime. 30 tablet 1   No  current facility-administered medications on file prior to visit.     BP 113/75   Pulse 74   Temp 98.3 F (36.8 C) (Oral)   Resp 16   Ht 6\' 1"  (1.854 m)   Wt 236 lb 9.6 oz (107.3 kg)   SpO2 100%   BMI 31.22 kg/m       Objective:   Physical Exam  General Appearance- Not in acute distress.  HEENT Eyes- Scleraeral/Conjuntiva-bilat- Not Yellow. Mouth & Throat- Normal.  Chest and Lung Exam Auscultation: Breath sounds:-Normal. Adventitious sounds:- No Adventitious sounds.  Cardiovascular Auscultation:Rythm - Regular. Heart Sounds -Normal heart  sounds.  Abdomen Inspection:-Inspection Normal.  Palpation/Perucssion: Palpation and Percussion of the abdomen reveal- left lower quadrant Tender to palpitation moderate, No Rebound tenderness, No rigidity(Guarding) and No Palpable abdominal masses.  Liver:-Normal.  Spleen:- Normal.   Back- no cva pain.      Assessment & Plan:  You do likely have diverticulitis based on location of your pain and diverticulosis on prior colonscopy.  Will rx cipro and flagyl and will get cbc. If pain perisists or worsens then get CT abdomen/pelvis.  Please notify us if not improving completely.  Follow up 10 days or as needed  Darril Patriarca, Percell Miller, [employer]

## 2017-01-23 ENCOUNTER — Encounter: Payer: Self-pay | Admitting: Medical

## 2017-01-24 ENCOUNTER — Telehealth: Payer: Self-pay | Admitting: Medical

## 2017-01-24 ENCOUNTER — Encounter: Payer: Self-pay | Admitting: Medical

## 2017-01-24 DIAGNOSIS — R1032 Left lower quadrant pain: Secondary | ICD-10-CM

## 2017-01-24 NOTE — Telephone Encounter (Signed)
Stats CBC, CMP and CT abdomen and pelvis place today. Will try to call patient and advised him to get these done stat. He has plans to leave town on a trip tomorrow. Concern for diverticulitis and possible complications. Note patient's pain was in the left lower quadrant,  history of diverticulosis and prior diverticulitis type symptoms responsive to antibiotics in the past.  Recent recurrent pain but now not improving with antibiotics. Some bloody stool reported.

## 2017-02-02 ENCOUNTER — Encounter (HOSPITAL_COMMUNITY): Payer: Self-pay | Admitting: Emergency Medicine

## 2017-02-02 ENCOUNTER — Emergency Department (HOSPITAL_COMMUNITY)
Admission: EM | Admit: 2017-02-02 | Discharge: 2017-02-03 | Disposition: A | Discharge status: Home / Self Care | Payer: BLUE CROSS/BLUE SHIELD | Attending: Emergency Medicine | Admitting: Emergency Medicine

## 2017-02-02 DIAGNOSIS — Z8719 Personal history of other diseases of the digestive system: Secondary | ICD-10-CM | POA: Diagnosis not present

## 2017-02-02 DIAGNOSIS — K589 Irritable bowel syndrome without diarrhea: Secondary | ICD-10-CM | POA: Diagnosis not present

## 2017-02-02 DIAGNOSIS — K625 Hemorrhage of anus and rectum: Secondary | ICD-10-CM | POA: Insufficient documentation

## 2017-02-02 DIAGNOSIS — R1084 Generalized abdominal pain: Secondary | ICD-10-CM | POA: Diagnosis not present

## 2017-02-02 DIAGNOSIS — Z79899 Other long term (current) drug therapy: Secondary | ICD-10-CM | POA: Insufficient documentation

## 2017-02-02 LAB — URINALYSIS, ROUTINE W REFLEX MICROSCOPIC
BILIRUBIN URINE: NEGATIVE
Glucose, UA: NEGATIVE mg/dL
HGB URINE DIPSTICK: NEGATIVE
Ketones, ur: NEGATIVE mg/dL
Leukocytes, UA: NEGATIVE
Nitrite: NEGATIVE
PROTEIN: NEGATIVE mg/dL
SPECIFIC GRAVITY, URINE: 1.018 (ref 1.005–1.030)
pH: 5 (ref 5.0–8.0)

## 2017-02-02 LAB — COMPREHENSIVE METABOLIC PANEL
ALK PHOS: 40 U/L (ref 38–126)
ALT: 19 U/L (ref 17–63)
ANION GAP: 8 (ref 5–15)
AST: 21 U/L (ref 15–41)
Albumin: 3.8 g/dL (ref 3.5–5.0)
BILIRUBIN TOTAL: 0.6 mg/dL (ref 0.3–1.2)
BUN: 18 mg/dL (ref 6–20)
CALCIUM: 9.2 mg/dL (ref 8.9–10.3)
CO2: 24 mmol/L (ref 22–32)
Chloride: 106 mmol/L (ref 101–111)
Creatinine, Ser: 0.96 mg/dL (ref 0.61–1.24)
GFR calc non Af Amer: >60 mL/min (ref >60)
Glucose, Bld: 112 mg/dL — ABNORMAL HIGH (ref 65–99)
POTASSIUM: 3.7 mmol/L (ref 3.5–5.1)
Sodium: 138 mmol/L (ref 135–145)
TOTAL PROTEIN: 6.4 g/dL — AB (ref 6.5–8.1)

## 2017-02-02 LAB — TYPE AND SCREEN
ABO/RH(D): O POS
ANTIBODY SCREEN: NEGATIVE

## 2017-02-02 LAB — CBC
HEMATOCRIT: 44.5 % (ref 39.0–52.0)
HEMOGLOBIN: 14.6 g/dL (ref 13.0–17.0)
MCH: 29.3 pg (ref 26.0–34.0)
MCHC: 32.8 g/dL (ref 30.0–36.0)
MCV: 89.2 fL (ref 78.0–100.0)
Platelets: 196 10*3/uL (ref 150–400)
RBC: 4.99 MIL/uL (ref 4.22–5.81)
RDW: 13.2 % (ref 11.5–15.5)
WBC: 7.7 10*3/uL (ref 4.0–10.5)

## 2017-02-02 LAB — LIPASE, BLOOD: LIPASE: 39 U/L (ref 11–51)

## 2017-02-02 LAB — ABO/RH: ABO/RH(D): O POS

## 2017-02-02 NOTE — ED Notes (Signed)
Updated on wait for treatment room. 

## 2017-02-02 NOTE — ED Triage Notes (Signed)
Pt has had blood in stool and general GI discomfort for over two weeks.  Did go to GP, tried antibiotics, continues to feel poorly w/ severe abdominal pain.  Reports weakness, dizziness and headaches.  Denies N/V.

## 2017-02-03 ENCOUNTER — Telehealth: Payer: Self-pay | Admitting: Medical

## 2017-02-03 MED ORDER — AMOXICILLIN-POT CLAVULANATE 500-125 MG PO TABS: 1.0000 | ORAL_TABLET | Freq: Three times a day (TID) | ORAL | 0 refills | Status: AC

## 2017-02-03 MED ORDER — ACETAMINOPHEN 325 MG PO TABS: 650.0000 mg | ORAL_TABLET | Freq: Four times a day (QID) | ORAL | 0 refills | Status: AC | PRN

## 2017-02-03 NOTE — Telephone Encounter (Signed)
Patient was seen in the emergency department recently. We you call him and see how he is doing.

## 2017-02-03 NOTE — ED Notes (Signed)
Pt stable, ambulatory, states understanding of discharge instructions 

## 2017-02-03 NOTE — Discharge Instructions (Signed)
PLEASE TAKE THE ANTIBIOTICS ONLY IF THERE IS WORSENING PAIN, FEVERS. See the GI doctor as requested.  Please return to the ER if your symptoms worsen; you have increased pain, fevers, chills, inability to keep any medications down, severe bleeding. Otherwise see the outpatient doctor as requested.

## 2017-02-03 NOTE — ED Provider Notes (Signed)
[name] DEPT Provider Note   CSN: [id number] Arrival date & time: 02/02/17  1946     History   Chief Complaint Chief Complaint  Patient presents with  . Blood In Stools  . Abdominal Pain    HPI Curtis Wood is a 56 y.o. male.  HPI Pt comes in with cc of abd discomfort. Pt has hx of GERD, hernia and per 04/2015 colonoscopy diverticulosis. PT reports that periodically he gets nonspecific abd symptoms like bloating, abd pain that is generalized and he usually gets better with prevacid. This time around he hasnt responded despite 2 weeks of prevacid. Pt also took cipro and flagyl, but that hasnt helped either. Pt has had intermittent bright red blood per rectum over 2 weeks.   The abd discomfort is intermittent. Pt has bloating and cramping type pain. Pt cant think of any specific evoking factor. Pt is not constipated. Today, pt started having headaches, dizziness, so he decided to come to the ER. Pt has no n/v/f/c. Pt denies family hx of IBD, IBS - but he has been diagnosed with IBS.    Past Medical History:  Diagnosis Date  . Allergy   . Anxiety   . Depression   . GERD (gastroesophageal reflux disease)   . Hiatal hernia 1990's    Patient Active Problem List   Diagnosis Date Noted  . 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 Wellness examination 06/28/2015  . Generalized anxiety disorder 04/21/2015  . Pain in the abdomen 04/21/2015    Past Surgical History:  Procedure Laterality Date  . Acl replacement Left   . CHOLECYSTECTOMY    . surgery for pigeon breast repair         Home Medications    Prior to Admission medications   Medication Sig Start Date End Date Taking? Authorizing Provider  acetaminophen (TYLENOL) 325 MG tablet Take 2 tablets (650 mg total) by mouth every 6 (six) hours as needed. 02/03/17   Varney Biles, MD  amoxicillin-clavulanate (AUGMENTIN) 500-125 MG tablet  Take 1 tablet (500 mg total) by mouth every 8 (eight) hours. 02/03/17   Varney Biles, MD  ciprofloxacin (CIPRO) 500 MG tablet Take 1 tablet (500 mg total) by mouth 2 (two) times daily. 01/22/17   Saguier, Percell Miller, PA-C  clonazePAM (KLONOPIN) 0.5 MG tablet Take 1 tablet (0.5 mg total) by mouth at bedtime. 09/20/16   Saguier, Percell Miller, PA-C  clonazePAM (KLONOPIN) 0.5 MG tablet Take 1 tablet (0.5 mg total) by mouth 2 (two) times daily as needed for anxiety. Can fill on 10-20-2016. Please not to refill sooner 01/09/17   Saguier, Percell Miller, PA-C  diclofenac (VOLTAREN) 75 MG EC tablet Take 1 tablet (75 mg total) by mouth 2 (two) times daily. 01/18/17   Saguier, Percell Miller, PA-C  gabapentin (NEURONTIN) 100 MG capsule Take 1 capsule (100 mg total) by mouth 3 (three) times daily. 08/01/16   Saguier, Percell Miller, PA-C  metroNIDAZOLE (FLAGYL) 500 MG tablet Take 1 tablet (500 mg total) by mouth 3 (three) times daily. 01/22/17   Saguier, Percell Miller, PA-C  zolpidem (AMBIEN) 10 MG tablet Take 1 tablet (10 mg total) by mouth at bedtime. 11/27/16 12/27/16  Saguier, Percell Miller, PA-C    Family History Family History  Problem Relation Age of Onset  . Colon cancer Neg Hx     Social History Social History  Substance Use Topics  . Smoking status: Never Smoker  . Smokeless tobacco: Never Used  . Alcohol use No  Comment: occasional wine      Allergies   Patient has no known allergies.   Review of Systems Review of Systems  Gastrointestinal: Positive for abdominal distention, abdominal pain, blood in stool, nausea and vomiting.  Neurological: Positive for dizziness and headaches.  All other systems reviewed and are negative.    Physical Exam Updated Vital Signs BP 122/83   Pulse 60   Temp 98.1 F (36.7 C) (Oral)   Resp 16   SpO2 99%   Physical Exam  Constitutional: He is oriented to person, place, and time. He appears well-developed.  HENT:  Head: Atraumatic.  Neck: Neck supple.  Cardiovascular: Normal rate.     Pulmonary/Chest: Effort normal.  Abdominal: Soft. Bowel sounds are normal. There is tenderness. There is no rebound and no guarding.  L sided mild tenderness  Neurological: He is alert and oriented to person, place, and time.  Skin: Skin is warm.  Nursing note and vitals reviewed.    ED Treatments / Results  Labs (all labs ordered are listed, but only abnormal results are displayed) Labs Reviewed  COMPREHENSIVE METABOLIC PANEL - Abnormal; Notable for the following:       Result Value   Glucose, Bld 112 (*)    Total Protein 6.4 (*)    All other components within normal limits  CBC  LIPASE, BLOOD  URINALYSIS, ROUTINE W REFLEX MICROSCOPIC  TYPE AND SCREEN  ABO/RH    EKG  EKG Interpretation None       Radiology No results found.  Procedures Procedures (including critical care time)  Medications Ordered in ED Medications - No data to display   Initial Impression / Assessment and Plan / ED Course  I have reviewed the triage vital signs and the nursing notes.  Pertinent labs & imaging results that were available during my care of the patient were reviewed by me and considered in my medical decision making (see chart for details).     Pt comes in with cc of  Abd discomfort for several weeks and bloody stools.  The abd discomfort has been present for several days the patient has hx of divetricular dz and IBS.  It seems like the IBS has been bothering him for several days, as pt has bloating and abd discomfort. Pt has had small amount of bloody stools as well intermittently - which is likely diverticular bleed. Pt has taken cipro and flagyl already. The bleeding is certainly not upper GI bleed.  Pt has no focal abd tenderness. There is no peritoneal signs. VSS and WNL. The labs are reassuring. I dont suspect complicated diverticulitis, perforation, intra-abd abscess or SBO. No indication for CT abd.  Strict ER return precautions have been discussed, and patient is  agreeing with the plan and is comfortable with the workup done and the recommendations from the ER.    Final Clinical Impressions(s) / ED Diagnoses   Final diagnoses:  History of GI diverticular bleed  Rectal bleeding  Irritable bowel syndrome, unspecified type    New Prescriptions Discharge Medication List as of 02/03/2017  3:41 AM    START taking these medications   Details  acetaminophen (TYLENOL) 325 MG tablet Take 2 tablets (650 mg total) by mouth every 6 (six) hours as needed., Starting Sat 02/03/2017, Print    amoxicillin-clavulanate (AUGMENTIN) 500-125 MG tablet Take 1 tablet (500 mg total) by mouth every 8 (eight) hours., Starting Sat 02/03/2017, Print         Varney Biles, MD 02/03/17 2206

## 2017-02-03 NOTE — ED Notes (Signed)
No blood work needed pt will be discharged.

## 2017-02-05 ENCOUNTER — Encounter: Payer: Self-pay | Admitting: Medical

## 2017-02-05 ENCOUNTER — Telehealth: Payer: Self-pay | Admitting: Internal Medicine

## 2017-02-05 ENCOUNTER — Ambulatory Visit (INDEPENDENT_AMBULATORY_CARE_PROVIDER_SITE_OTHER): Payer: BLUE CROSS/BLUE SHIELD | Admitting: Medical

## 2017-02-05 VITALS — BP 121/74 | HR 72 | Temp 98.0°F | Resp 16 | Ht 73.0 in | Wt 237.6 lb

## 2017-02-05 DIAGNOSIS — K589 Irritable bowel syndrome without diarrhea: Secondary | ICD-10-CM

## 2017-02-05 DIAGNOSIS — R109 Unspecified abdominal pain: Secondary | ICD-10-CM | POA: Diagnosis not present

## 2017-02-05 DIAGNOSIS — K5792 Diverticulitis of intestine, part unspecified, without perforation or abscess without bleeding: Secondary | ICD-10-CM

## 2017-02-05 DIAGNOSIS — K219 Gastro-esophageal reflux disease without esophagitis: Secondary | ICD-10-CM

## 2017-02-05 MED ORDER — PANTOPRAZOLE SODIUM 20 MG PO TBEC: 20.0000 mg | DELAYED_RELEASE_TABLET | Freq: Every day | ORAL | 1 refills | Status: DC

## 2017-02-05 MED ORDER — DICYCLOMINE HCL 10 MG PO CAPS
10.0000 mg | ORAL_CAPSULE | Freq: Three times a day (TID) | ORAL | 0 refills | Status: DC
Start: 2017-02-05 — End: 2017-03-23

## 2017-02-05 NOTE — Telephone Encounter (Signed)
I would recommend proceeding with CT abd/pelvis with contrast given recent hx and his history of diverticulitis Office visit with me or APP

## 2017-02-05 NOTE — Telephone Encounter (Signed)
Pt scheduled to see Tye Savoy NP 02/07/17@10 :30am. Dr. Hilarie Fredrickson did recommend CT of A/P if they will order.

## 2017-02-05 NOTE — Telephone Encounter (Signed)
Pt has some this to discuss with PCP scheduled  ED follow up today.

## 2017-02-05 NOTE — Telephone Encounter (Signed)
Hx of abdomen pain that was in the region of left lower quadrant around 01/20/2017. Patient was placed on Cipro and Flagyl but while he was on vacation he states his abdomen pain got worse. His experience bright red blood in stools for about 5 days. Then this subsided. Over the past week he is now feeling better with no abdomen pain in the left lower quadrant. He does have some diffuse mild discomfort consistent with his history of IBS. Most recent CBC was negative. Would like patient to be seen for evaluation of the recent left lower quadrant pain. Known history of diverticulosis on 2016 colonoscopy. I am ordering a ifob to test for any residual blood. Would you like me to go ahead and order a CT of abdomen before he sees GI in light of patient's recent symptoms.

## 2017-02-05 NOTE — Telephone Encounter (Signed)
Dr Pyrtle-please advise.... 

## 2017-02-05 NOTE — Patient Instructions (Addendum)
For your abdomen pain will prescribe them bentyl for your IBS history, protonix for recent reflux symptoms( with history of reflux), and will get Ifob testing.  IBS is diagnosis of exclusion but it also sounds that you had a bout of diverticulitis treated successfully with antibiotics. Since you have no pain on abdomen exam today I have decided not to get the CT scan. But I do want to get for you to do stool tests for blood. If test is positive then would likely go ahead and order the CT scan in light of the fact that I will also put in GI referral.  If you do get recurrent severe abdomen pain again as you had in [place] than please notify us and would go ahead and advise to get that formerly placed CT area  Follow-up in 2-3 weeks or as needed.

## 2017-02-05 NOTE — Progress Notes (Signed)
Subjective:    Patient ID: Curtis Wood, male    DOB: [birth year], 56 y.o.   MRN: [id number]  HPI  Pt in for evaluation. Follow up from the ED.  Pt wbc was not elevated the other day. Pt states while he was out of town in [place] he had some rectal bleeding. Pt states he has hx of ibs in past. He states this in the past is more frequent than diverticulitis type signs or symptoms.(He had presented more like diverticulitis on the visit prior to him leaving for [place])   He also states in past with history of prevacid use he stomach would feel better. He requests something stronger than Prevacid. He does note years ago in [place] he had EGD which showed some gastritis.   He states along with llq pain he felt generally diffuse bloating. Also some mild epigastric discomfort.    Pt was while he was in [place] had some bright red blood in his stools.  These prominent left lower quadrant pain started around 01-20-2017. I had placed him on flagyl and cipro before he went out ot town to [place].  While in [place] had high level stess related to some family court proceedings. It sounded like with the high level stress that his IBS type symptoms may have flared as well.(Told years ago that he had IBS.)  Pt had colonoscopy in 2016. Some diverticulosis noted on that study.       Review of Systems  Constitutional: Negative for chills, fatigue and fever.  HENT: Negative for dental problem, ear discharge, ear pain, hearing loss, mouth sores, postnasal drip, rhinorrhea, sinus pressure and sore throat.   Respiratory: Negative for cough, chest tightness, shortness of breath and wheezing.   Cardiovascular: Negative for chest pain and palpitations.  Gastrointestinal: Positive for abdominal pain. Negative for abdominal distention, blood in stool, constipation, diarrhea, rectal pain and vomiting.       Much less now than previously.  Genitourinary: Negative for dysuria and  flank pain.  Musculoskeletal: Negative for back pain, gait problem, myalgias and neck stiffness.  Skin: Negative for rash.  Neurological: Negative for dizziness, seizures, speech difficulty, weakness and headaches.  Hematological: Negative for adenopathy. Does not bruise/bleed easily.  Psychiatric/Behavioral: Negative for behavioral problems, confusion and dysphoric mood. The patient is not nervous/anxious.     Past Medical History:  Diagnosis Date  . Allergy   . Anxiety   . Depression   . GERD (gastroesophageal reflux disease)   . Hiatal hernia 1990's     Social History   Social History  . Marital status: Married    Spouse name: N/A  . Number of children: N/A  . Years of education: N/A   Occupational History  . Not on file.   Social History Main Topics  . Smoking status: Never Smoker  . Smokeless tobacco: Never Used  . Alcohol use No     Comment: occasional wine   . Drug use: No  . Sexual activity: Yes    Partners: Female    Birth control/ protection: None   Other Topics Concern  . Not on file   Social History Narrative  . No narrative on file    Past Surgical History:  Procedure Laterality Date  . Acl replacement Left   . CHOLECYSTECTOMY    . surgery for pigeon breast repair      Family History  Problem Relation Age of Onset  . Colon cancer Neg Hx  No Known Allergies  Current Outpatient Prescriptions on File Prior to Visit  Medication Sig Dispense Refill  . acetaminophen (TYLENOL) 325 MG tablet Take 2 tablets (650 mg total) by mouth every 6 (six) hours as needed. 30 tablet 0  . amoxicillin-clavulanate (AUGMENTIN) 500-125 MG tablet Take 1 tablet (500 mg total) by mouth every 8 (eight) hours. 21 tablet 0  . ciprofloxacin (CIPRO) 500 MG tablet Take 1 tablet (500 mg total) by mouth 2 (two) times daily. 20 tablet 0  . clonazePAM (KLONOPIN) 0.5 MG tablet Take 1 tablet (0.5 mg total) by mouth at bedtime. 30 tablet 0  . clonazePAM (KLONOPIN) 0.5 MG tablet  Take 1 tablet (0.5 mg total) by mouth 2 (two) times daily as needed for anxiety. Can fill on 10-20-2016. Please not to refill sooner 60 tablet 0  . diclofenac (VOLTAREN) 75 MG EC tablet Take 1 tablet (75 mg total) by mouth 2 (two) times daily. 30 tablet 0  . gabapentin (NEURONTIN) 100 MG capsule Take 1 capsule (100 mg total) by mouth 3 (three) times daily. 90 capsule 0  . metroNIDAZOLE (FLAGYL) 500 MG tablet Take 1 tablet (500 mg total) by mouth 3 (three) times daily. 30 tablet 0  . zolpidem (AMBIEN) 10 MG tablet Take 1 tablet (10 mg total) by mouth at bedtime. 30 tablet 1   No current facility-administered medications on file prior to visit.     BP 121/74   Pulse 72   Temp 98 F (36.7 C) (Oral)   Resp 16   Ht 6\' 1"  (1.854 m)   Wt 237 lb 9.6 oz (107.8 kg)   SpO2 100%   BMI 31.35 kg/m       Objective:   Physical Exam   General Appearance- Not in acute distress.  HEENT Eyes- Scleraeral/Conjuntiva-bilat- Not Yellow. Mouth & Throat- Normal.  Chest and Lung Exam Auscultation: Breath sounds:-Normal. Adventitious sounds:- No Adventitious sounds.  Cardiovascular Auscultation:Rythm - Regular. Heart Sounds -Normal heart sounds.  Abdomen Inspection:-Inspection Normal.  Palpation/Perucssion: Palpation and Percussion of the abdomen reveal- Non Tender, No Rebound tenderness, No rigidity(Guarding) and No Palpable abdominal masses.  Liver:-Normal.  Spleen:- Normal.    Back- no cva tenderness.      Assessment & Plan:   For your abdomen pain will prescribe them bentyl for your IBS history, protonix for recent reflux symptoms with history of reflux, and will get I follow-up testing.  IBS is diagnosis of exclusion but it also sounds that you had a bout of diverticulitis treated successfully with antibiotics. Since you have no pain on abdomen exam today I have decided not to get the CT scan. But I do want to get for you to do stool tests for blood. If test is positive then would likely  go ahead and order the CT scan in light of the fact that I will also put in GI referral.  If you do get recurrent severe abdomen pain again as you had in [place] than please notify us and would go ahead and advise to get that formerly placed CT area  Follow-up in 2-3 weeks or as needed.  Note during the interview patient started about the cost of getting CT. So I decided would not order immediately.  Maryana Pittmon, Percell Miller, PA-C

## 2017-02-06 ENCOUNTER — Telehealth: Payer: Self-pay | Admitting: Medical

## 2017-02-06 NOTE — Telephone Encounter (Signed)
Urine drug screen review today. Metabolite of clonazepam seen which is expected. Patient's moderate risk due to the fact that he has used marijuana in the past. He stated that he was not going to use any further and THC did not show on this drug screen. Patient is aware/or was made aware in the past that [facility] showed up again that could not prescribing control medications. Appears she is compliant.

## 2017-02-07 ENCOUNTER — Ambulatory Visit: Payer: Self-pay | Admitting: Nurse Practitioner

## 2017-02-12 ENCOUNTER — Telehealth: Payer: Self-pay | Admitting: Medical

## 2017-02-12 ENCOUNTER — Encounter: Payer: Self-pay | Admitting: Medical

## 2017-02-12 NOTE — Telephone Encounter (Signed)
Pt was a no show to Gi? What happened.

## 2017-02-12 NOTE — Telephone Encounter (Signed)
LMOVM for pt to RTC to advise how he is doing as he missed his GI appt/thx dmf

## 2017-02-19 ENCOUNTER — Other Ambulatory Visit: Payer: Self-pay | Admitting: Medical

## 2017-02-20 ENCOUNTER — Telehealth: Payer: Self-pay | Admitting: Medical

## 2017-02-22 ENCOUNTER — Other Ambulatory Visit: Payer: Self-pay

## 2017-02-22 MED ORDER — ZOLPIDEM TARTRATE 10 MG PO TABS: 10.0000 mg | ORAL_TABLET | Freq: Every day | ORAL | 1 refills | Status: DC

## 2017-02-22 NOTE — Telephone Encounter (Signed)
Refill Request: Zolpidem   Last RX:11/27/16 x1  Last OV:02/05/17 Next WT:GRMB  UDS:01/11/17 CSC:03/13/16

## 2017-02-22 NOTE — Telephone Encounter (Signed)
I will refill patient's Ambien. But please explained to him and document was advise not to use Ambien and clonazepam at the same time. If he wants to use Ambien to help him sleep then he should not use clonazepam that night. Again please advise him and document he was advised. I'm going to sign the prescription and leave it on your desk.

## 2017-02-23 ENCOUNTER — Encounter: Payer: Self-pay | Admitting: Medical

## 2017-02-26 NOTE — Telephone Encounter (Signed)
Pt is requesting refill on clonazepam 0.5mg .  Last OV: 02/05/2017 Last Fill: 01/09/2017 #60 and 0RF UDS: 01/11/2017 Moderate risk  Please advise.

## 2017-02-27 MED ORDER — CLONAZEPAM 0.5 MG PO TABS
0.5000 mg | ORAL_TABLET | Freq: Every day | ORAL | 0 refills | Status: DC
Start: 2017-02-27 — End: 2017-04-04

## 2017-02-27 NOTE — Telephone Encounter (Signed)
Notified pt not to Ambien and Clonazepam at together. Pt states he does not take them at the same time.

## 2017-02-27 NOTE — Telephone Encounter (Signed)
Prescription of clonazepam printed. You can fax that in to his pharmacy. Would you call him and advise it was sent.  Remind him that he is not to use Ambien and clonazepam at the same time. If he uses Ambien at night then just use clonazepam earlier in the day. Document that he was advised.

## 2017-03-01 ENCOUNTER — Ambulatory Visit (INDEPENDENT_AMBULATORY_CARE_PROVIDER_SITE_OTHER): Payer: BLUE CROSS/BLUE SHIELD | Admitting: Medical

## 2017-03-01 ENCOUNTER — Encounter: Payer: Self-pay | Admitting: Medical

## 2017-03-01 VITALS — BP 122/80 | HR 69 | Temp 98.3°F | Resp 16 | Ht 73.0 in | Wt 235.4 lb

## 2017-03-01 DIAGNOSIS — R059 Cough, unspecified: Secondary | ICD-10-CM

## 2017-03-01 DIAGNOSIS — J029 Acute pharyngitis, unspecified: Secondary | ICD-10-CM

## 2017-03-01 DIAGNOSIS — J40 Bronchitis, not specified as acute or chronic: Secondary | ICD-10-CM | POA: Diagnosis not present

## 2017-03-01 DIAGNOSIS — R05 Cough: Secondary | ICD-10-CM

## 2017-03-01 MED ORDER — AZITHROMYCIN 250 MG PO TABS: ORAL_TABLET | ORAL | 0 refills | Status: DC

## 2017-03-01 MED ORDER — BENZONATATE 100 MG PO CAPS: 100.0000 mg | ORAL_CAPSULE | Freq: Three times a day (TID) | ORAL | 0 refills | Status: AC | PRN

## 2017-03-01 MED ORDER — FLUTICASONE PROPIONATE 50 MCG/ACT NA SUSP: 2.0000 | Freq: Every day | NASAL | 1 refills | Status: AC

## 2017-03-01 NOTE — Patient Instructions (Signed)
You appear to have early bronchitis with some pharyngitis as well. Rest hydrate and tylenol for fever. I am prescribing cough medicine benzonatate, and azithroymcyin antibiotic. For your nasal congestion rx flonase.  You should gradually get better. If not then notify us and would recommend a chest xray.  Follow up in 7-10 days or as needed

## 2017-03-01 NOTE — Progress Notes (Signed)
Subjective:    Patient ID: Curtis Wood, male    DOB: 1961-05-13, 56 y.o.   MRN: [id number]  HPI  About a week ago he was walking and got caught in the rain. Little colder weather then 2 days later had nasal congestion and faint soar throat.  He feels some chest congestion and beginning to cough. Some mucous productive.   No fever, no chills or sweats. Mild fatigue.  Symptoms for about 1 week.  Review of Systems  Constitutional: Negative for chills, fatigue and fever.  HENT: Positive for congestion and sore throat. Negative for hearing loss, postnasal drip, sinus pain and sinus pressure.   Respiratory: Positive for cough. Negative for chest tightness, shortness of breath and wheezing.        Chest congestion.  Cardiovascular: Negative for chest pain and palpitations.  Gastrointestinal: Negative for abdominal pain, blood in stool, constipation, nausea and vomiting.  Musculoskeletal: Negative for back pain and myalgias.  Neurological: Negative for dizziness, seizures, weakness, numbness and headaches.  Hematological: Negative for adenopathy. Does not bruise/bleed easily.  Psychiatric/Behavioral: Negative for behavioral problems, confusion and suicidal ideas.   Past Medical History:  Diagnosis Date  . Allergy   . Anxiety   . Depression   . GERD (gastroesophageal reflux disease)   . Hiatal hernia 1990's     Social History   Social History  . Marital status: Married    Spouse name: N/A  . Number of children: N/A  . Years of education: N/A   Occupational History  . Not on file.   Social History Main Topics  . Smoking status: Never Smoker  . Smokeless tobacco: Never Used  . Alcohol use No     Comment: occasional wine   . Drug use: No  . Sexual activity: Yes    Partners: Female    Birth control/ protection: None   Other Topics Concern  . Not on file   Social History Narrative  . No narrative on file    Past Surgical History:  Procedure Laterality Date  .  Acl replacement Left   . CHOLECYSTECTOMY    . surgery for pigeon breast repair      Family History  Problem Relation Age of Onset  . Colon cancer Neg Hx     No Known Allergies  Current Outpatient Prescriptions on File Prior to Visit  Medication Sig Dispense Refill  . acetaminophen (TYLENOL) 325 MG tablet Take 2 tablets (650 mg total) by mouth every 6 (six) hours as needed. 30 tablet 0  . amoxicillin-clavulanate (AUGMENTIN) 500-125 MG tablet Take 1 tablet (500 mg total) by mouth every 8 (eight) hours. 21 tablet 0  . ciprofloxacin (CIPRO) 500 MG tablet Take 1 tablet (500 mg total) by mouth 2 (two) times daily. 20 tablet 0  . clonazePAM (KLONOPIN) 0.5 MG tablet Take 1 tablet (0.5 mg total) by mouth 2 (two) times daily as needed for anxiety. Can fill on 10-20-2016. Please not to refill sooner 60 tablet 0  . clonazePAM (KLONOPIN) 0.5 MG tablet Take 1 tablet (0.5 mg total) by mouth at bedtime. 30 tablet 0  . diclofenac (VOLTAREN) 75 MG EC tablet Take 1 tablet (75 mg total) by mouth 2 (two) times daily. 30 tablet 0  . dicyclomine (BENTYL) 10 MG capsule Take 1 capsule (10 mg total) by mouth 3 (three) times daily before meals. 90 capsule 0  . gabapentin (NEURONTIN) 100 MG capsule Take 1 capsule (100 mg total) by mouth 3 (three) times daily. [place]  capsule 0  . metroNIDAZOLE (FLAGYL) 500 MG tablet Take 1 tablet (500 mg total) by mouth 3 (three) times daily. 30 tablet 0  . pantoprazole (PROTONIX) 20 MG tablet Take 1 tablet (20 mg total) by mouth daily. 30 tablet 1  . zolpidem (AMBIEN) 10 MG tablet Take 1 tablet (10 mg total) by mouth at bedtime. 30 tablet 1   No current facility-administered medications on file prior to visit.     BP 122/80   Pulse 69   Temp 98.3 F (36.8 C) (Oral)   Resp 16   Ht 6\' 1"  (1.854 m)   Wt 235 lb 6.4 oz (106.8 kg)   SpO2 99%   BMI 31.06 kg/m       Objective:   Physical Exam   General  Mental Status - Alert. General Appearance - Well groomed. Not in acute  distress.  Skin Rashes- No Rashes.  HEENT Head- Normal. Ear Auditory Canal - Left- Normal. Right - Normal.Tympanic Membrane- Left- Normal. Right- Normal. Eye Sclera/Conjunctiva- Left- Normal. Right- Normal. Nose & Sinuses Nasal Mucosa- Left-  Boggy and Congested. Right-  Boggy and  Congested.Bilateral maxillary and frontal sinus pressure. Mouth & Throat Lips: Upper Lip- Normal: no dryness, cracking, pallor, cyanosis, or vesicular eruption. Lower Lip-Normal: no dryness, cracking, pallor, cyanosis or vesicular eruption. Buccal Mucosa- Bilateral- No Aphthous ulcers. Oropharynx- No Discharge or Erythema. Tonsils: Characteristics- Bilateral- faint Erythema and  Congestion. Size/Enlargement- Bilateral- No enlargement. Discharge- bilateral-None.  Neck Neck- Supple. No Masses.   Chest and Lung Exam Auscultation: Breath Sounds:-Clear even and unlabored.  Cardiovascular Auscultation:Rythm- Regular, rate and rhythm. Murmurs & Other Heart Sounds:Ausculatation of the heart reveal- No Murmurs.  Lymphatic Head & Neck General Head & Neck Lymphatics: Bilateral: Description- No Localized lymphadenopathy.      Assessment & Plan:  You appear to have early bronchitis with some pharyngitis as well. Rest hydrate and tylenol for fever. I am prescribing cough medicine benzonatate, and azithroymcyin antibiotic. For your nasal congestion rx flonase.  You should gradually get better. If not then notify us and would recommend a chest xray.  Follow up in 7-10 days or as needed  Jaxiel Kines, Percell Miller, [employer]

## 2017-03-04 IMAGING — DX DG CERVICAL SPINE 2 OR 3 VIEWS
3 series · 3 of 3 positions shown · non-contrast
Comparison: None.

CLINICAL DATA: Cervical spine pain with left-sided radiculopathy
for 2 months

EXAM:
CERVICAL SPINE - 2-3 VIEW

[c-spine lat]
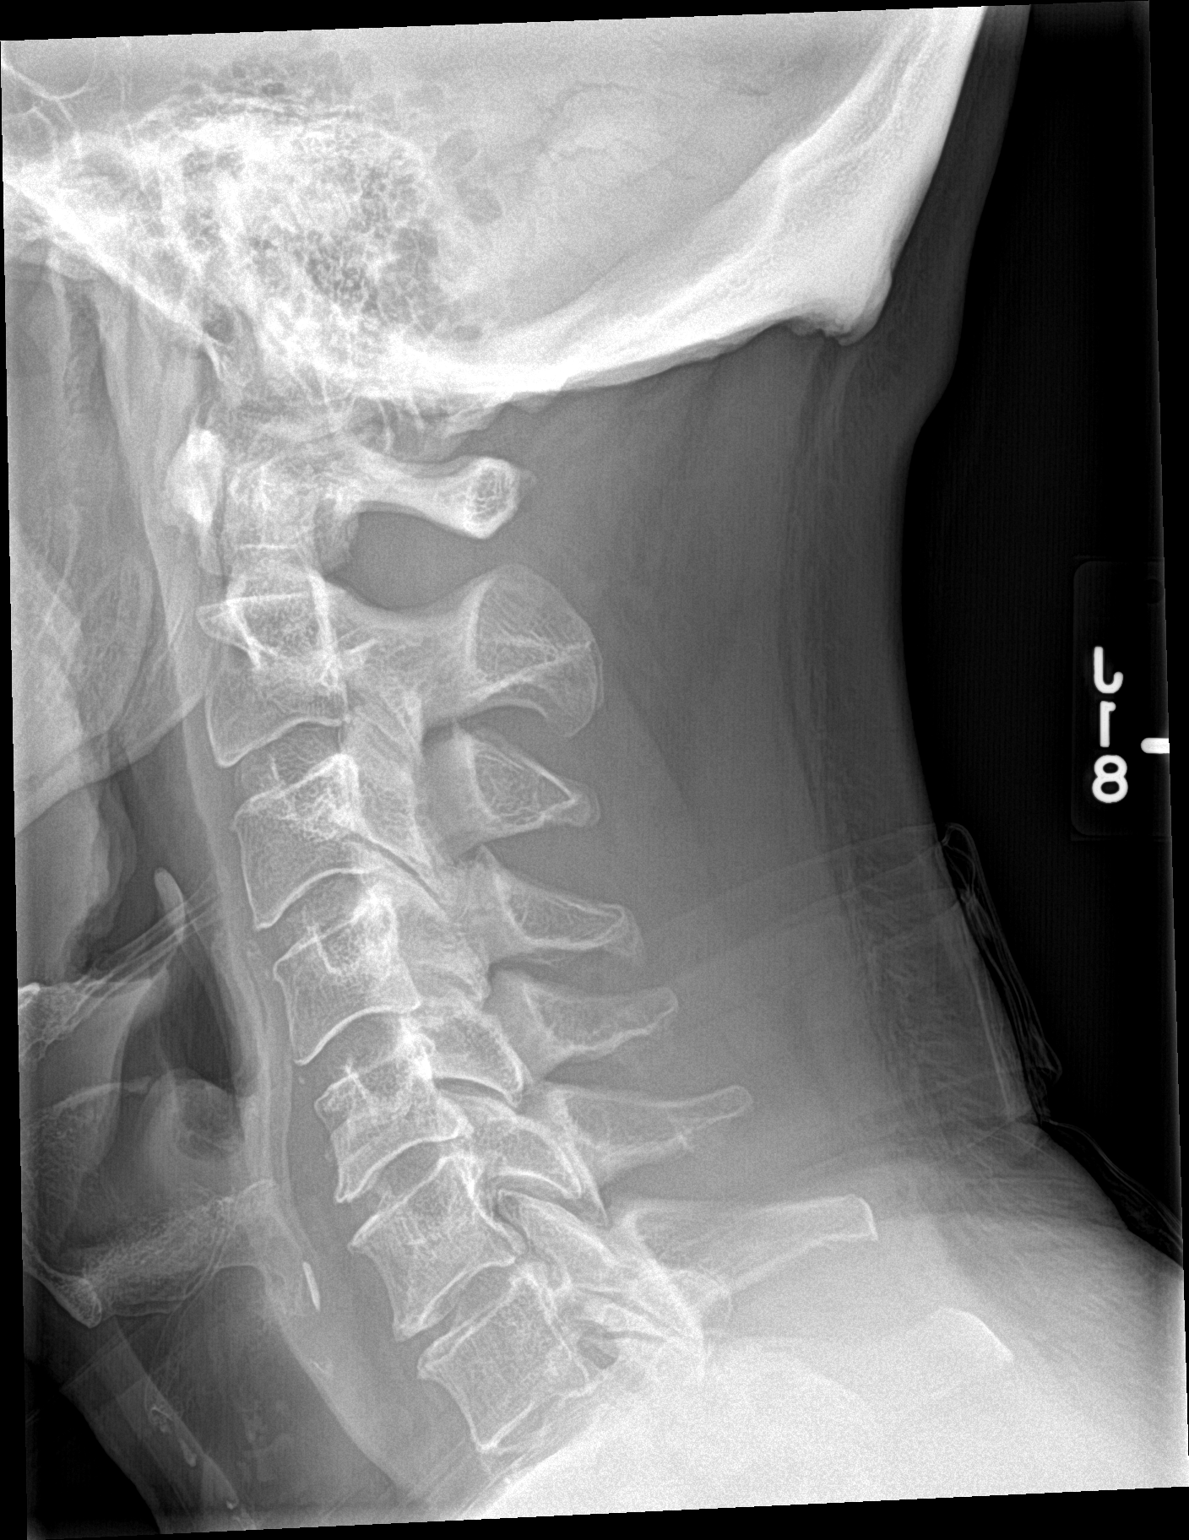

[c-spine ap]
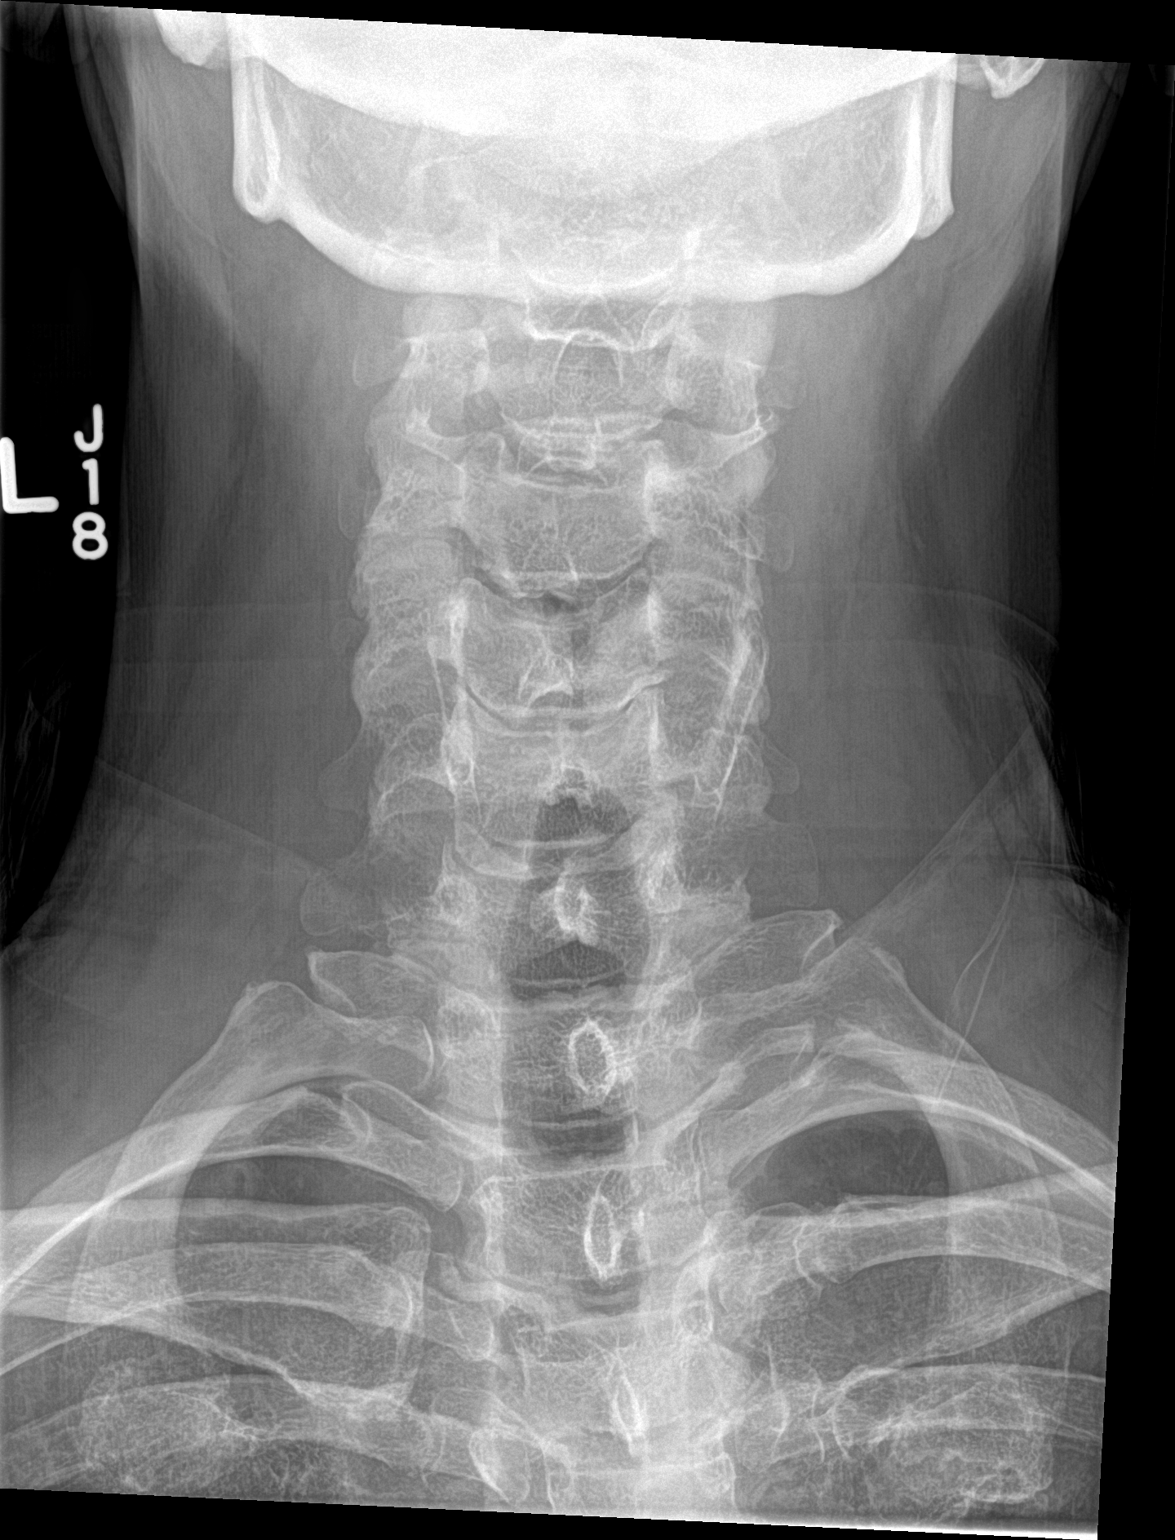

[c-spine open mouth]
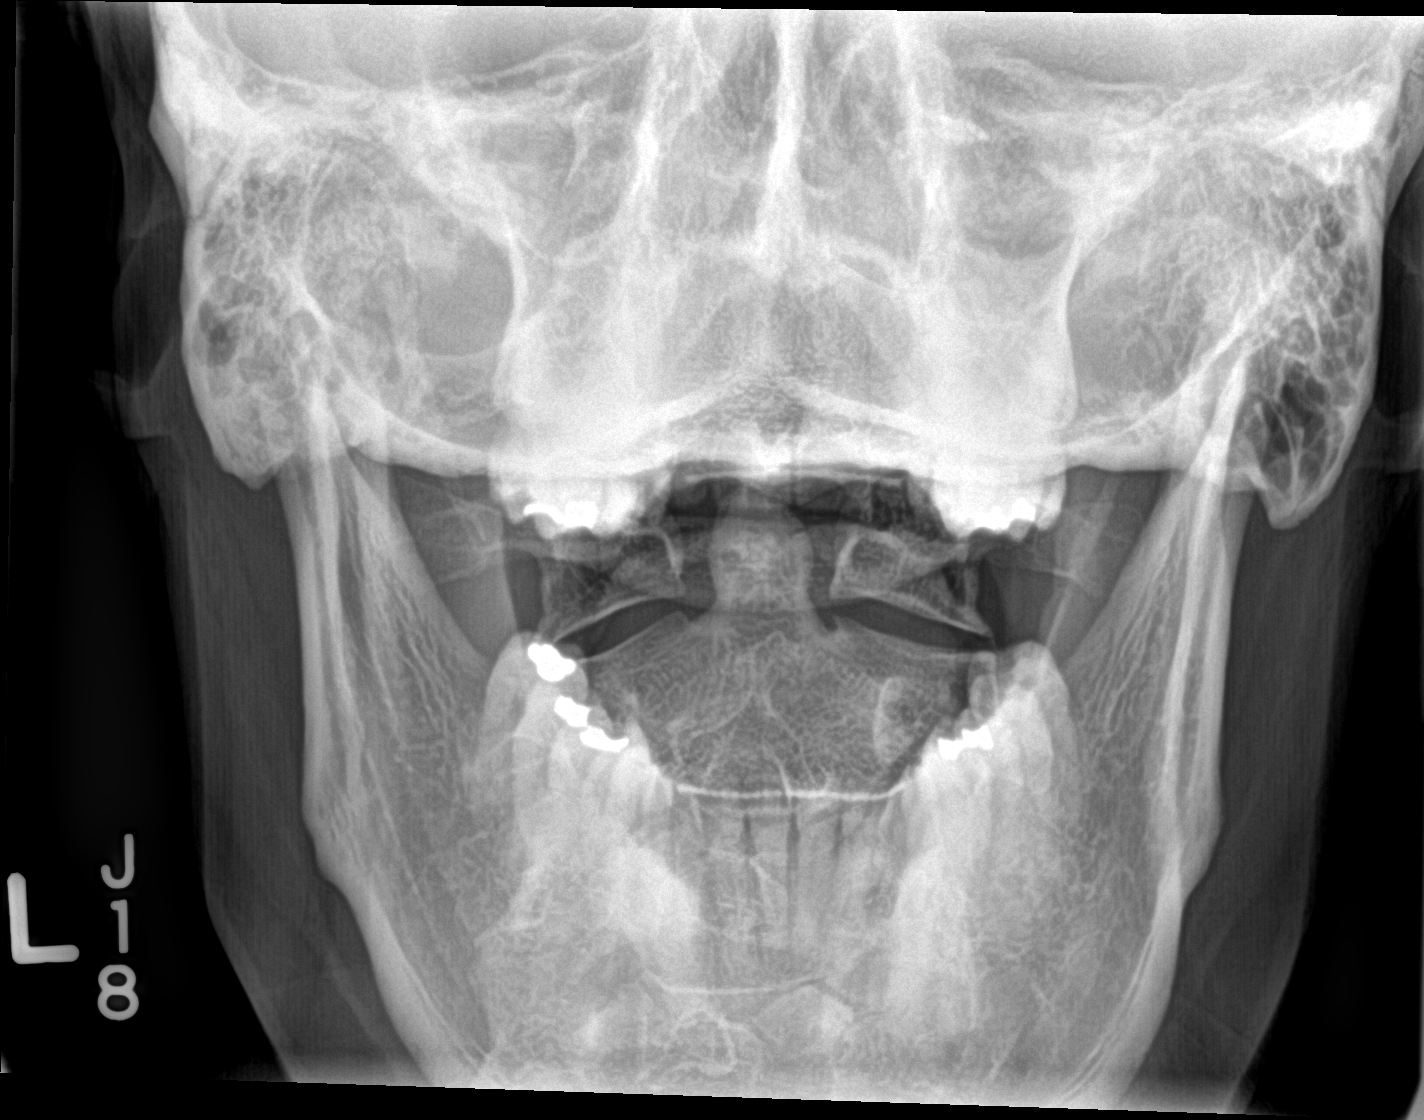

[3 of 3 positions shown; findings below may reference images not displayed]

FINDINGS: There is no evidence of cervical spine fracture or prevertebral soft
tissue swelling. Alignment is normal. No other significant bone
abnormalities are identified. There is degenerative disc disease
with mild disc height loss at C5-6 and C6-7.
IMPRESSION: Negative cervical spine radiographs.

## 2017-03-13 ENCOUNTER — Other Ambulatory Visit (INDEPENDENT_AMBULATORY_CARE_PROVIDER_SITE_OTHER): Payer: BLUE CROSS/BLUE SHIELD

## 2017-03-13 DIAGNOSIS — Z0000 Encounter for general adult medical examination without abnormal findings: Secondary | ICD-10-CM | POA: Diagnosis not present

## 2017-03-14 LAB — FECAL OCCULT BLOOD, IMMUNOCHEMICAL: Fecal Occult Bld: NEGATIVE

## 2017-03-16 ENCOUNTER — Ambulatory Visit: Payer: BLUE CROSS/BLUE SHIELD | Admitting: Nurse Practitioner

## 2017-03-21 ENCOUNTER — Encounter: Payer: Self-pay | Admitting: Medical

## 2017-03-23 ENCOUNTER — Other Ambulatory Visit: Payer: Self-pay | Admitting: Medical

## 2017-03-26 ENCOUNTER — Encounter: Payer: Self-pay | Admitting: Medical

## 2017-03-27 ENCOUNTER — Telehealth: Payer: Self-pay | Admitting: Medical

## 2017-03-27 ENCOUNTER — Ambulatory Visit: Payer: BLUE CROSS/BLUE SHIELD | Admitting: Nurse Practitioner

## 2017-03-27 MED ORDER — PANTOPRAZOLE SODIUM 20 MG PO TBEC: 20.0000 mg | DELAYED_RELEASE_TABLET | Freq: Every day | ORAL | 0 refills | Status: AC

## 2017-03-27 NOTE — Telephone Encounter (Signed)
rx protonix sent to pt pharmacy.

## 2017-04-04 ENCOUNTER — Other Ambulatory Visit: Payer: Self-pay

## 2017-04-04 MED ORDER — CLONAZEPAM 0.5 MG PO TABS: 0.5000 mg | ORAL_TABLET | Freq: Every day | ORAL | 0 refills | Status: DC

## 2017-04-04 NOTE — Progress Notes (Signed)
Refill Request: Clonazepam   Last RX:02/27/2017 Last OV:03/01/2017 Next OV: None schedule  UDS:01/11/2017 CSC:01/11/2017

## 2017-04-05 ENCOUNTER — Other Ambulatory Visit: Payer: Self-pay | Admitting: Emergency Medicine

## 2017-04-05 MED ORDER — CLONAZEPAM 0.5 MG PO TABS: 0.5000 mg | ORAL_TABLET | Freq: Every day | ORAL | 0 refills | Status: DC

## 2017-04-06 ENCOUNTER — Other Ambulatory Visit: Payer: Self-pay

## 2017-04-25 ENCOUNTER — Encounter: Payer: Self-pay | Admitting: Medical

## 2017-04-26 ENCOUNTER — Telehealth: Payer: Self-pay | Admitting: Medical

## 2017-04-26 MED ORDER — ZOLPIDEM TARTRATE 10 MG PO TABS: 10.0000 mg | ORAL_TABLET | Freq: Every day | ORAL | 0 refills | Status: AC

## 2017-04-26 NOTE — Telephone Encounter (Signed)
Faxed rx to pharmacy  

## 2017-04-26 NOTE — Telephone Encounter (Signed)
ambien rx printed. I am signing. Call pt and clarify which pharmacy we are to send rx to.  Also let him now I just sent my chart message.

## 2017-05-02 ENCOUNTER — Telehealth: Payer: Self-pay | Admitting: Medical

## 2017-05-02 MED ORDER — CLONAZEPAM 0.5 MG PO TABS: 0.5000 mg | ORAL_TABLET | Freq: Every day | ORAL | 0 refills | Status: AC

## 2017-05-02 NOTE — Telephone Encounter (Signed)
Rx of clonazepam sent to patient's pharmacy.  Patient is moving out of state later this month.  So we sent him my chart message advising/informing him that he will need to establish care in [place].

## 2017-05-02 NOTE — Telephone Encounter (Signed)
Patient is moving out of state on May 17, 2017.  You go ahead and take me off as his PCP.  In the event he does not move and come back in to see me then switch him back.

## 2017-05-04 ENCOUNTER — Telehealth: Payer: Self-pay | Admitting: Medical

## 2017-05-04 MED ORDER — AZITHROMYCIN 250 MG PO TABS: ORAL_TABLET | ORAL | 0 refills | Status: AC

## 2017-05-04 NOTE — Telephone Encounter (Signed)
Patient sent my chart message describing sinus infection.  We are approaching potentially long weekend and possible snowstorm.  In addition patient recently had some unemployment.  So I did send in azithromycin.  Also advised Flonase over-the-counter.  Advised if he does not improve then he did in the office.

## 2018-01-13 IMAGING — DX DG KNEE 3 VIEWS*L*
3 series · 3 of 3 positions shown · non-contrast
Comparison: None.

CLINICAL DATA: Chronic left knee pain. History of arthroscopic
surgery and ACL repair.

EXAM:
LEFT KNEE - 3 VIEW

[knee ap]
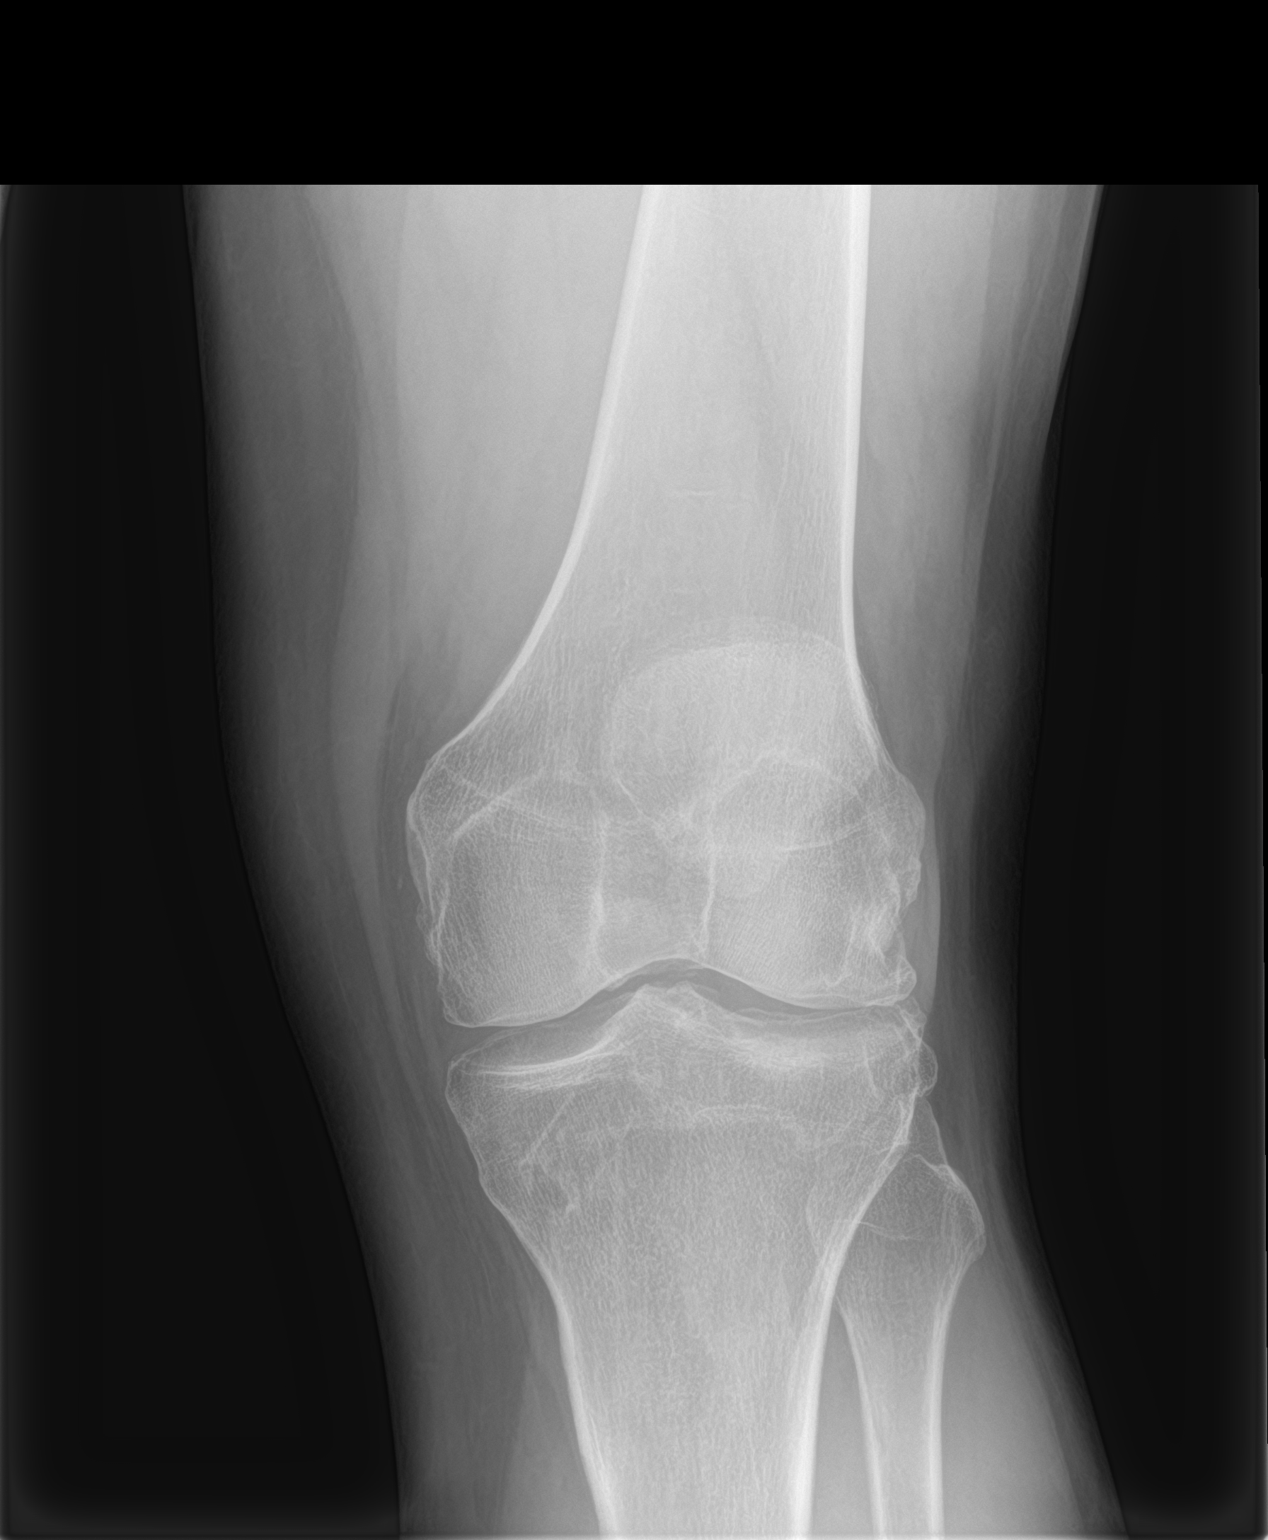

[knee lat]
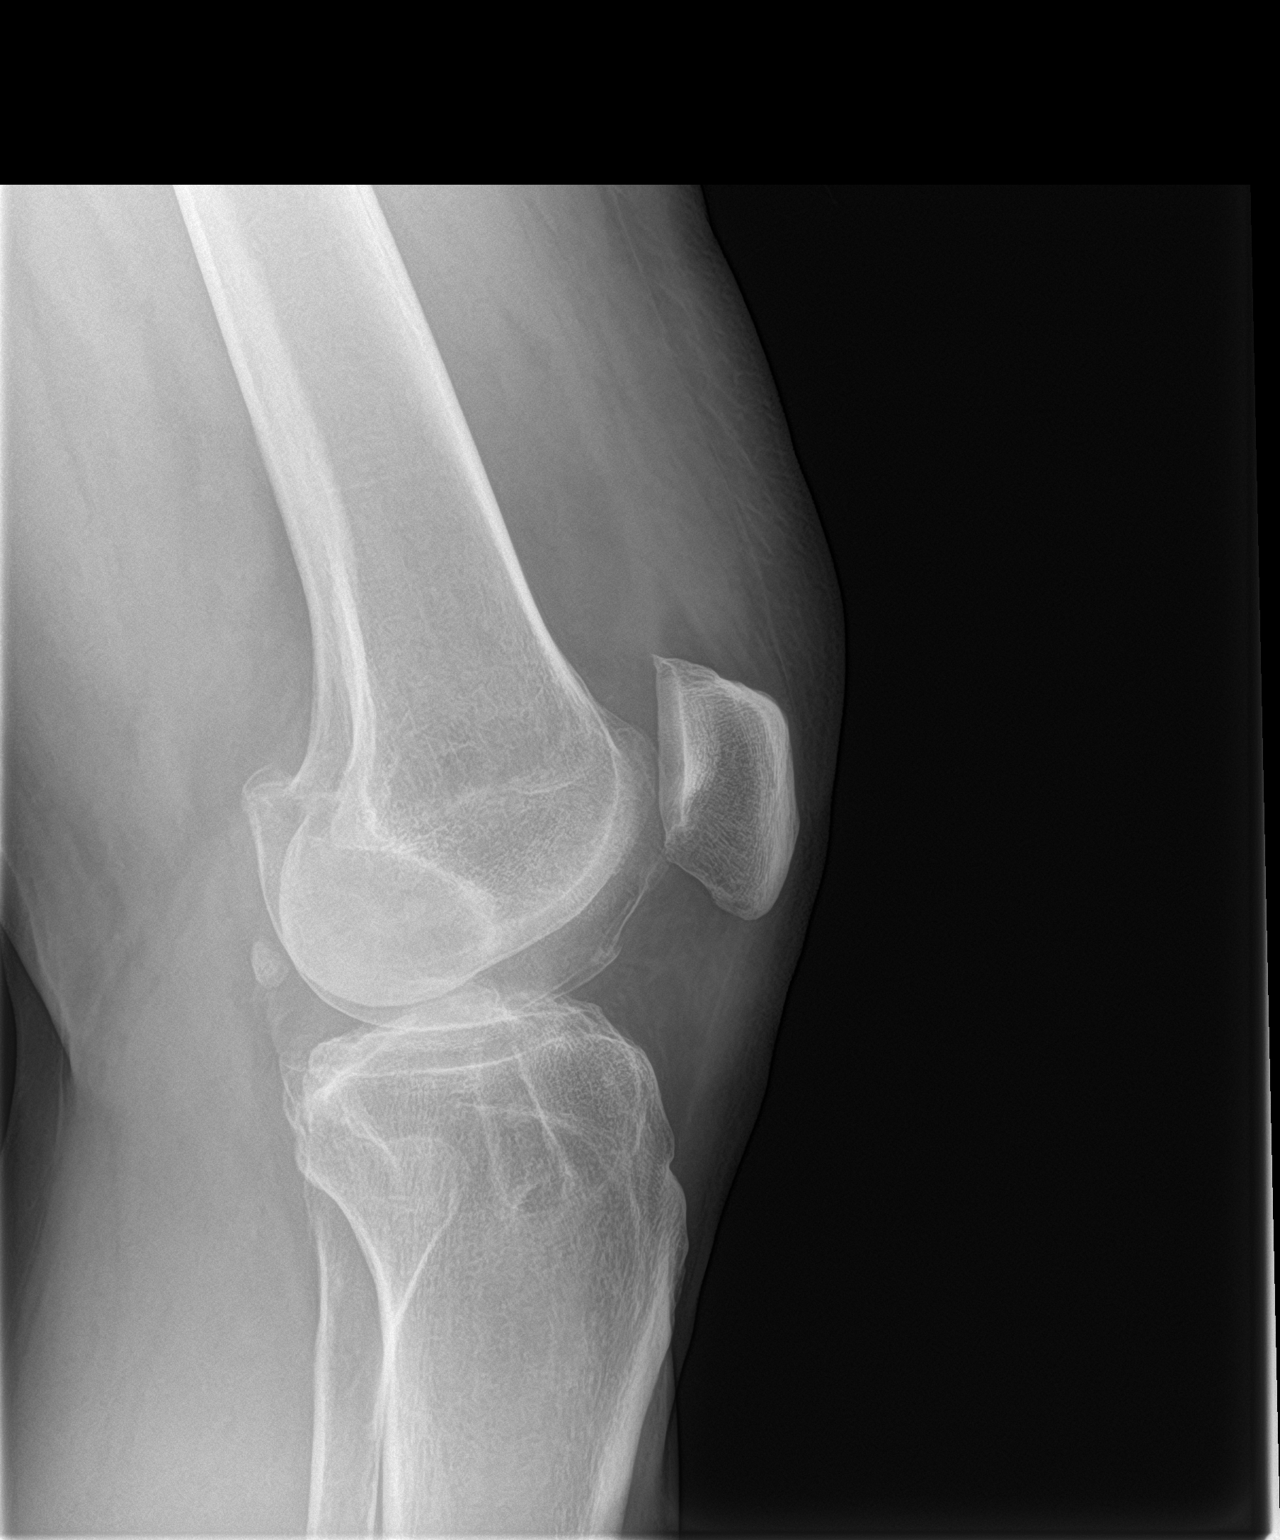

[knee sunrise]
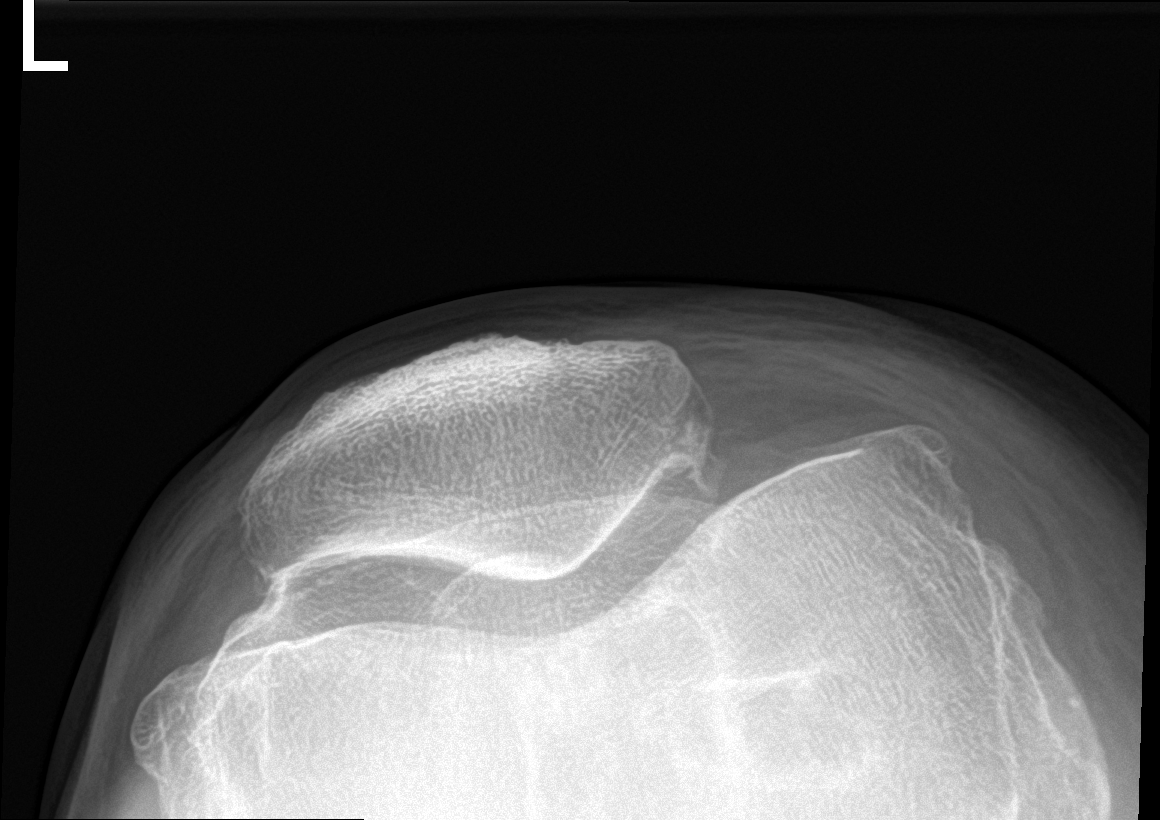

[3 of 3 positions shown; findings below may reference images not displayed]

FINDINGS: No acute fracture.  No bone lesion.

There is narrowing of the lateral femorotibial joint space
compartment. Small marginal osteophytes project from all 3
compartments. No joint effusion.

Soft tissues are unremarkable.
IMPRESSION: 1. No fracture or acute finding.
2. Degenerative changes most prominent involving the lateral
compartment.

## 2018-07-16 ENCOUNTER — Encounter: Payer: Self-pay | Admitting: Internal Medicine

## 2018-07-17 IMAGING — US US EXTREM LOW VENOUS*R*
1 series · 13 of 24 positions shown · non-contrast
Comparison: None.

CLINICAL DATA: Decreased sensation in the right calf



[Series 1: us extrem low venous*right* · 0.08mm/px · 13 of 38 slices shown]
[im 1/38]
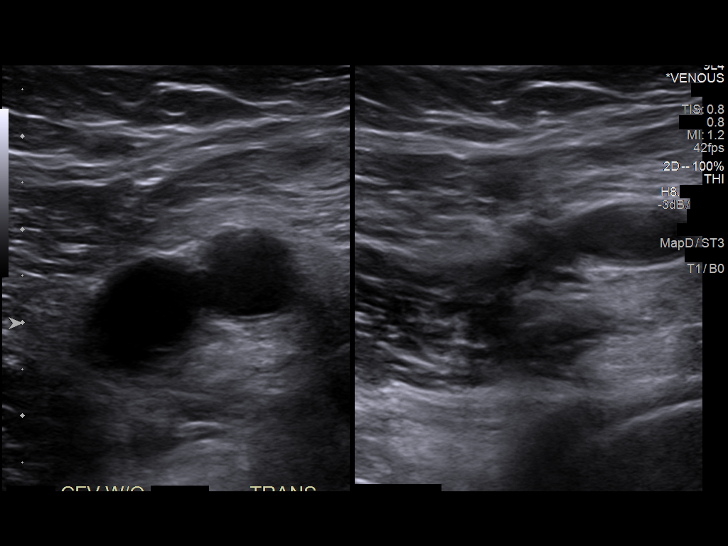
[im 4/38]
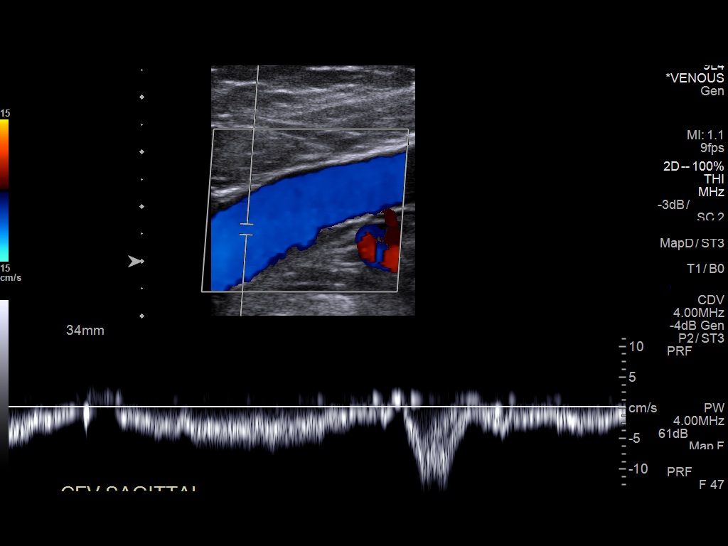
[im 7/38]
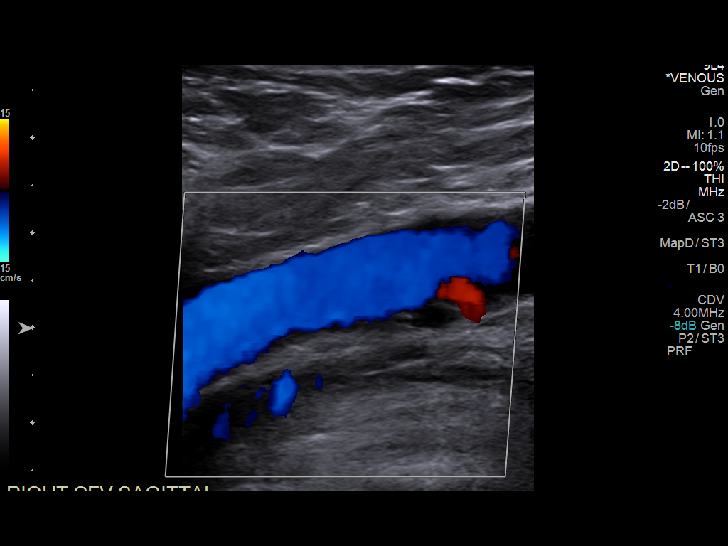
[im 10/38]
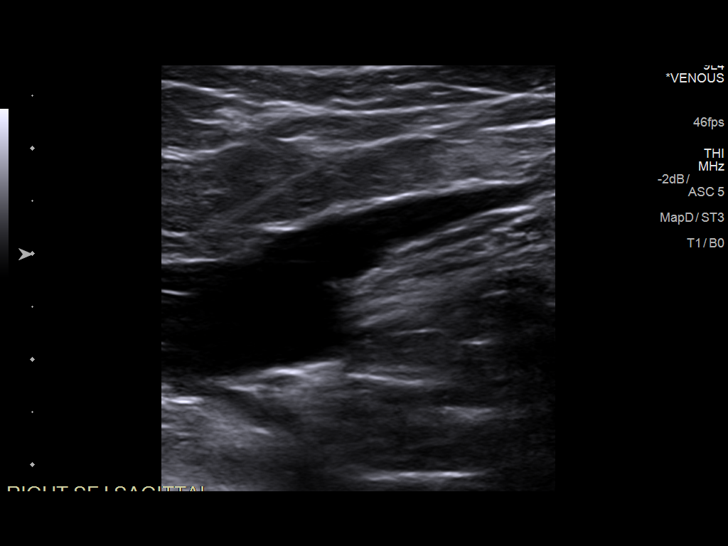
[im 13/38]
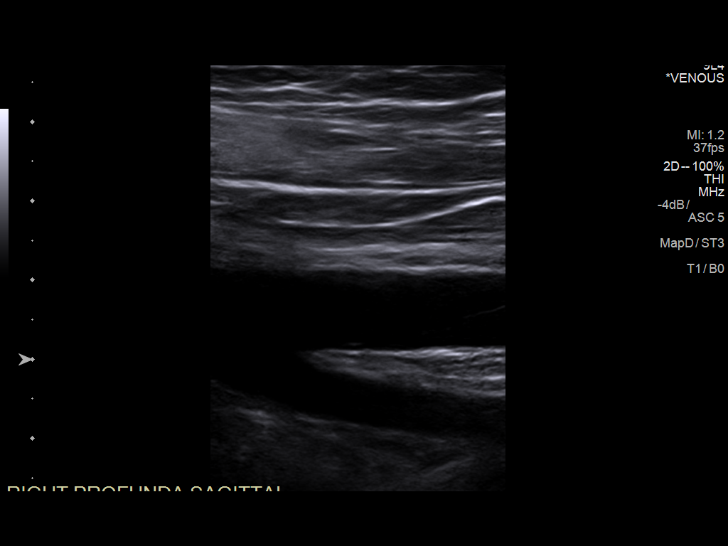
[im 17/38]
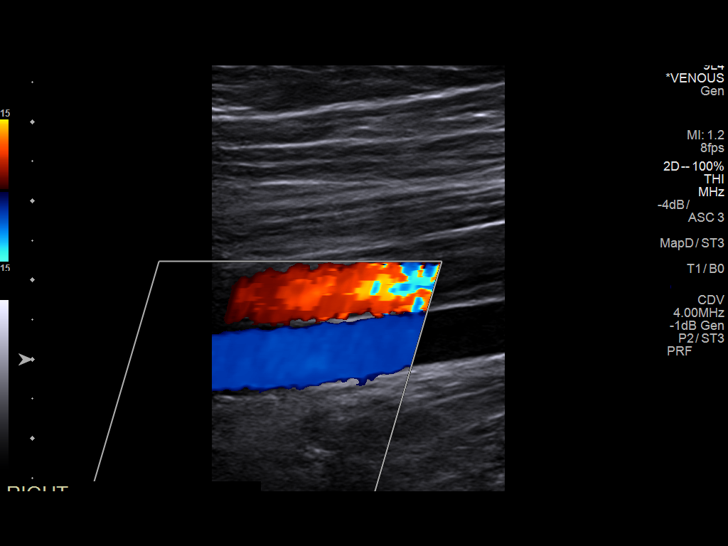
[im 20/38]
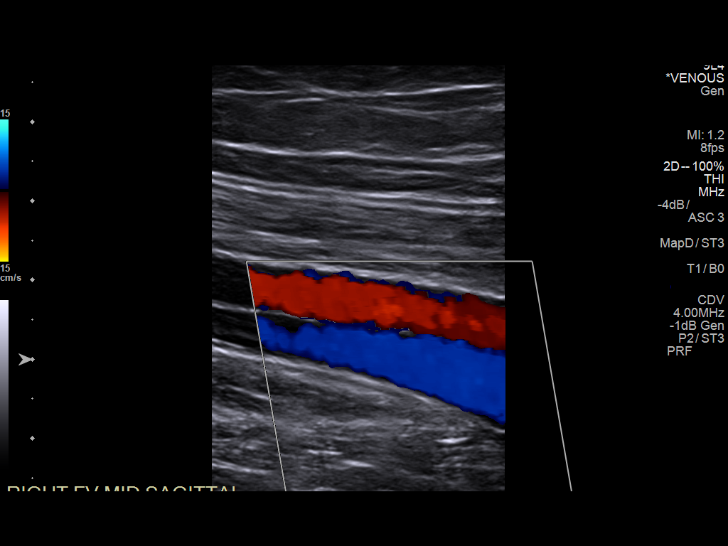
[im 21/38]
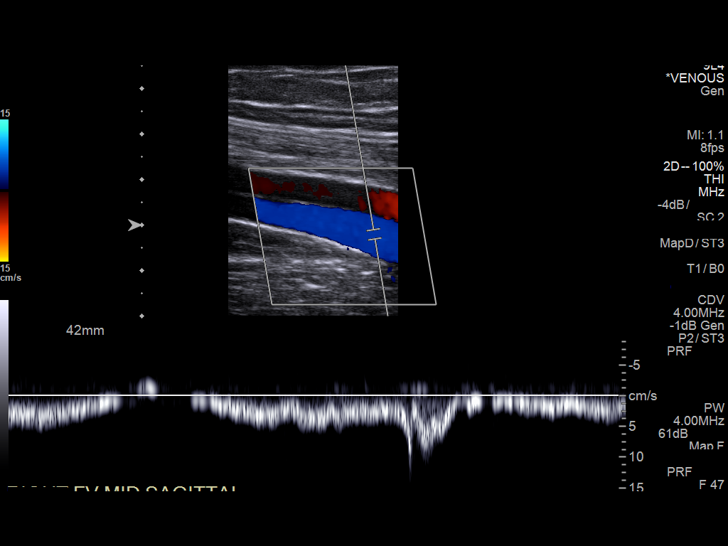
[im 25/38]
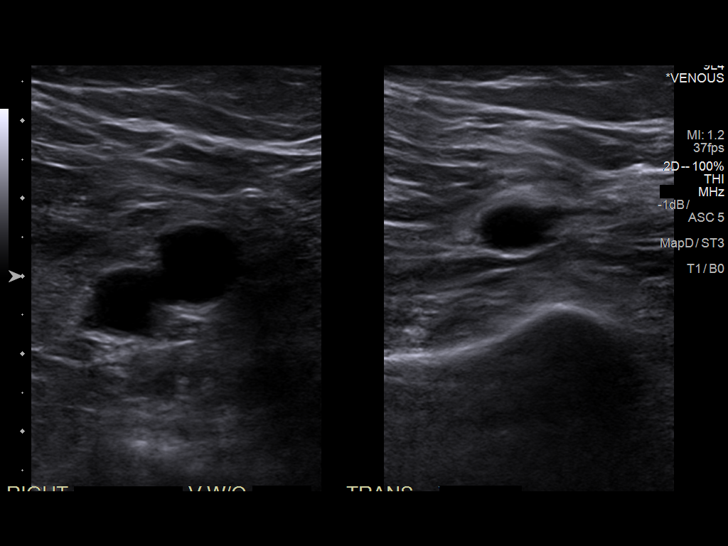
[im 28/38]
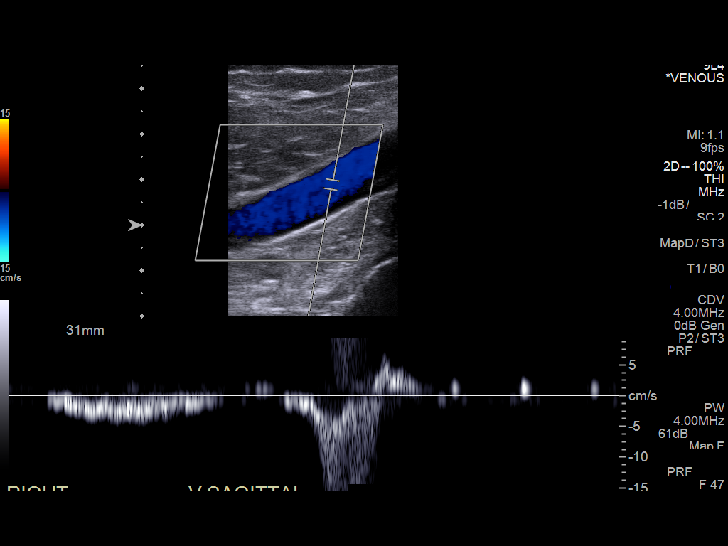
[im 31/38]
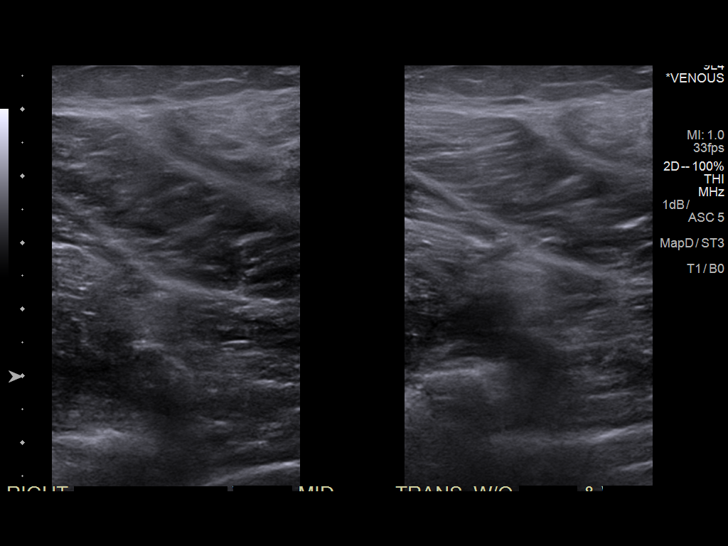
[im 34/38]
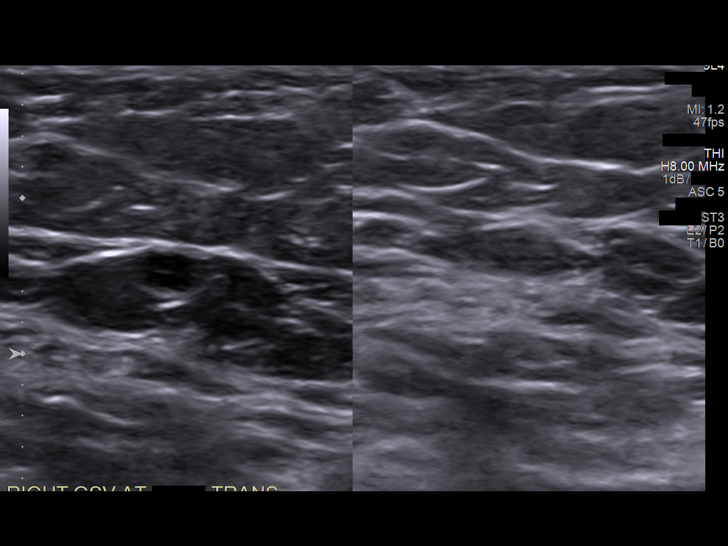
[im 38/38]
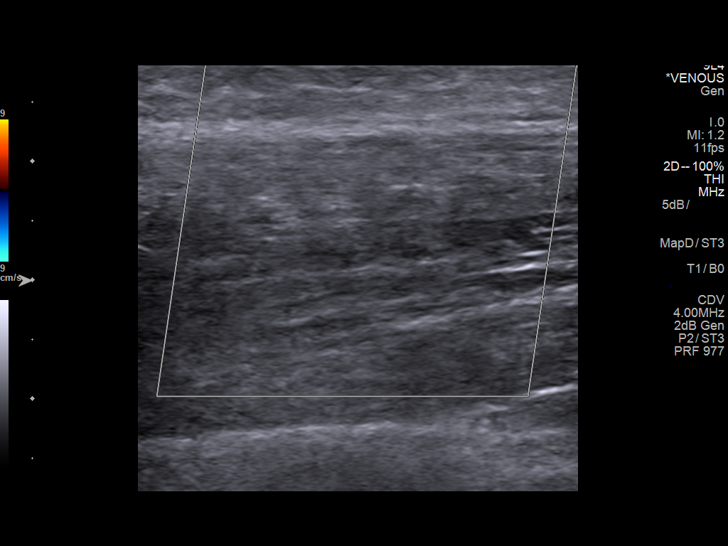

[13 of 24 positions shown; findings below may reference images not displayed]

FINDINGS: Contralateral Common Femoral Vein: Respiratory phasicity is normal
and symmetric with the symptomatic side. No evidence of thrombus.
Normal compressibility.

Common Femoral Vein: No evidence of thrombus. Normal
compressibility, respiratory phasicity and response to augmentation.

Saphenofemoral Junction: No evidence of thrombus. Normal
compressibility and flow on color Doppler imaging.

Profunda Femoral Vein: No evidence of thrombus. Normal
compressibility and flow on color Doppler imaging.

Femoral Vein: No evidence of thrombus. Normal compressibility,
respiratory phasicity and response to augmentation.

Popliteal Vein: No evidence of thrombus. Normal compressibility,
respiratory phasicity and response to augmentation.

Calf Veins: No evidence of thrombus. Normal compressibility and flow
on color Doppler imaging.

Superficial Great Saphenous Vein: No evidence of thrombus. Normal
compressibility and flow on color Doppler imaging.

Venous Reflux:  None.

Other Findings:  None.
IMPRESSION: No evidence of DVT within the right lower extremity.

## 2018-07-30 ENCOUNTER — Encounter: Payer: Self-pay | Admitting: Internal Medicine
